# Patient Record
Sex: Male | Born: 1940 | Race: Black or African American | Hispanic: No | Marital: Married | Smoking: Never smoker
Health system: Southern US, Community
[De-identification: ages and names within clinical notes are randomized; demographics above are authoritative.]

## PROBLEM LIST (undated history)

## (undated) DIAGNOSIS — I2510 Atherosclerotic heart disease of native coronary artery without angina pectoris: Secondary | ICD-10-CM

## (undated) DIAGNOSIS — I10 Essential (primary) hypertension: Secondary | ICD-10-CM

## (undated) DIAGNOSIS — E119 Type 2 diabetes mellitus without complications: Secondary | ICD-10-CM

## (undated) DIAGNOSIS — E785 Hyperlipidemia, unspecified: Secondary | ICD-10-CM

## (undated) DIAGNOSIS — N189 Chronic kidney disease, unspecified: Secondary | ICD-10-CM

## (undated) HISTORY — PX: CARDIAC CATHETERIZATION: SHX172

## (undated) HISTORY — PX: CORONARY ANGIOPLASTY: SHX604

## (undated) HISTORY — PX: HERNIA REPAIR: SHX51

---

## 2015-01-16 ENCOUNTER — Inpatient Hospital Stay
Admission: EM | Admit: 2015-01-16 | Discharge: 2015-01-24 | DRG: 682 | Disposition: A | Discharge status: Skilled Nursing Facility | Payer: Medicare Other | Attending: Internal Medicine | Admitting: Internal Medicine

## 2015-01-16 ENCOUNTER — Emergency Department: Payer: Medicare Other

## 2015-01-16 ENCOUNTER — Encounter: Payer: Self-pay | Admitting: Internal Medicine

## 2015-01-16 DIAGNOSIS — I5033 Acute on chronic diastolic (congestive) heart failure: Secondary | ICD-10-CM | POA: Diagnosis present

## 2015-01-16 DIAGNOSIS — N184 Chronic kidney disease, stage 4 (severe): Secondary | ICD-10-CM | POA: Diagnosis present

## 2015-01-16 DIAGNOSIS — N179 Acute kidney failure, unspecified: Secondary | ICD-10-CM | POA: Diagnosis present

## 2015-01-16 DIAGNOSIS — M109 Gout, unspecified: Secondary | ICD-10-CM | POA: Diagnosis present

## 2015-01-16 DIAGNOSIS — Z79899 Other long term (current) drug therapy: Secondary | ICD-10-CM | POA: Diagnosis not present

## 2015-01-16 DIAGNOSIS — Z794 Long term (current) use of insulin: Secondary | ICD-10-CM | POA: Diagnosis not present

## 2015-01-16 DIAGNOSIS — Z8546 Personal history of malignant neoplasm of prostate: Secondary | ICD-10-CM | POA: Diagnosis not present

## 2015-01-16 DIAGNOSIS — J811 Chronic pulmonary edema: Secondary | ICD-10-CM

## 2015-01-16 DIAGNOSIS — I272 Other secondary pulmonary hypertension: Secondary | ICD-10-CM | POA: Diagnosis present

## 2015-01-16 DIAGNOSIS — N2581 Secondary hyperparathyroidism of renal origin: Secondary | ICD-10-CM | POA: Diagnosis present

## 2015-01-16 DIAGNOSIS — R0602 Shortness of breath: Secondary | ICD-10-CM

## 2015-01-16 DIAGNOSIS — I2510 Atherosclerotic heart disease of native coronary artery without angina pectoris: Secondary | ICD-10-CM | POA: Diagnosis present

## 2015-01-16 DIAGNOSIS — E785 Hyperlipidemia, unspecified: Secondary | ICD-10-CM | POA: Diagnosis present

## 2015-01-16 DIAGNOSIS — Z7982 Long term (current) use of aspirin: Secondary | ICD-10-CM | POA: Diagnosis not present

## 2015-01-16 DIAGNOSIS — E876 Hypokalemia: Secondary | ICD-10-CM | POA: Diagnosis not present

## 2015-01-16 DIAGNOSIS — D631 Anemia in chronic kidney disease: Secondary | ICD-10-CM | POA: Diagnosis present

## 2015-01-16 DIAGNOSIS — Z888 Allergy status to other drugs, medicaments and biological substances status: Secondary | ICD-10-CM

## 2015-01-16 DIAGNOSIS — E1122 Type 2 diabetes mellitus with diabetic chronic kidney disease: Secondary | ICD-10-CM | POA: Diagnosis present

## 2015-01-16 DIAGNOSIS — E8770 Fluid overload, unspecified: Secondary | ICD-10-CM

## 2015-01-16 DIAGNOSIS — J810 Acute pulmonary edema: Secondary | ICD-10-CM

## 2015-01-16 DIAGNOSIS — J9601 Acute respiratory failure with hypoxia: Secondary | ICD-10-CM | POA: Diagnosis present

## 2015-01-16 DIAGNOSIS — I130 Hypertensive heart and chronic kidney disease with heart failure and stage 1 through stage 4 chronic kidney disease, or unspecified chronic kidney disease: Secondary | ICD-10-CM | POA: Diagnosis present

## 2015-01-16 HISTORY — DX: Atherosclerotic heart disease of native coronary artery without angina pectoris: I25.10

## 2015-01-16 HISTORY — DX: Type 2 diabetes mellitus without complications: E11.9

## 2015-01-16 HISTORY — DX: Essential (primary) hypertension: I10

## 2015-01-16 HISTORY — DX: Hyperlipidemia, unspecified: E78.5

## 2015-01-16 HISTORY — DX: Chronic kidney disease, unspecified: N18.9

## 2015-01-16 LAB — GLUCOSE, CAPILLARY: GLUCOSE-CAPILLARY: 288 mg/dL — AB (ref 65–99)

## 2015-01-16 LAB — HEPATIC FUNCTION PANEL
ALBUMIN: 2.4 g/dL — AB (ref 3.5–5.0)
ALK PHOS: 81 U/L (ref 38–126)
ALT: 11 U/L — ABNORMAL LOW (ref 17–63)
AST: 22 U/L (ref 15–41)
BILIRUBIN TOTAL: 0.3 mg/dL (ref 0.3–1.2)
Bilirubin, Direct: <0.1 mg/dL — ABNORMAL LOW (ref 0.1–0.5)
Total Protein: 6.5 g/dL (ref 6.5–8.1)

## 2015-01-16 LAB — MAGNESIUM: Magnesium: 1.4 mg/dL — ABNORMAL LOW (ref 1.7–2.4)

## 2015-01-16 LAB — CBC WITH DIFFERENTIAL/PLATELET
BASOS ABS: 0.1 10*3/uL (ref 0–0.1)
Basophils Relative: 1 %
Eosinophils Absolute: 0.3 10*3/uL (ref 0–0.7)
Eosinophils Relative: 3 %
HEMATOCRIT: 24.6 % — AB (ref 40.0–52.0)
Hemoglobin: 8.2 g/dL — ABNORMAL LOW (ref 13.0–18.0)
LYMPHS PCT: 5 %
Lymphs Abs: 0.5 10*3/uL — ABNORMAL LOW (ref 1.0–3.6)
MCH: 28.3 pg (ref 26.0–34.0)
MCHC: 33.3 g/dL (ref 32.0–36.0)
MCV: 85.1 fL (ref 80.0–100.0)
MONO ABS: 0.4 10*3/uL (ref 0.2–1.0)
Monocytes Relative: 4 %
NEUTROS ABS: 9 10*3/uL — AB (ref 1.4–6.5)
Neutrophils Relative %: 87 %
Platelets: 277 10*3/uL (ref 150–440)
RBC: 2.89 MIL/uL — AB (ref 4.40–5.90)
RDW: 16 % — ABNORMAL HIGH (ref 11.5–14.5)
WBC: 10.3 10*3/uL (ref 3.8–10.6)

## 2015-01-16 LAB — BASIC METABOLIC PANEL
ANION GAP: 10 (ref 5–15)
BUN: 52 mg/dL — ABNORMAL HIGH (ref 6–20)
CHLORIDE: 105 mmol/L (ref 101–111)
CO2: 25 mmol/L (ref 22–32)
Calcium: 6.4 mg/dL — CL (ref 8.9–10.3)
Creatinine, Ser: 4.6 mg/dL — ABNORMAL HIGH (ref 0.61–1.24)
GFR calc Af Amer: 13 mL/min — ABNORMAL LOW (ref >60)
GFR calc non Af Amer: 11 mL/min — ABNORMAL LOW (ref >60)
GLUCOSE: 386 mg/dL — AB (ref 65–99)
POTASSIUM: 3.3 mmol/L — AB (ref 3.5–5.1)
Sodium: 140 mmol/L (ref 135–145)

## 2015-01-16 LAB — INFLUENZA PANEL BY PCR (TYPE A & B)

## 2015-01-16 LAB — TROPONIN I: Troponin I: <0.03 ng/mL (ref <0.031)

## 2015-01-16 LAB — BRAIN NATRIURETIC PEPTIDE: B NATRIURETIC PEPTIDE 5: 522 pg/mL — AB (ref 0.0–100.0)

## 2015-01-16 MED ORDER — SODIUM CHLORIDE 0.9 % IV SOLN
1.0000 g | Freq: Once | INTRAVENOUS | Status: AC
  Administered 2015-01-16: 1 g via INTRAVENOUS
  Filled 2015-01-16: qty 10

## 2015-01-16 MED ORDER — IPRATROPIUM-ALBUTEROL 0.5-2.5 (3) MG/3ML IN SOLN
3.0000 mL | Freq: Once | RESPIRATORY_TRACT | Status: AC
  Administered 2015-01-16: 3 mL via RESPIRATORY_TRACT
  Filled 2015-01-16: qty 3

## 2015-01-16 MED ORDER — INSULIN ASPART 100 UNIT/ML ~~LOC~~ SOLN: 0.0000 [IU] | Freq: Three times a day (TID) | SUBCUTANEOUS | Status: DC

## 2015-01-16 MED ORDER — ASPIRIN 81 MG PO CHEW
81.0000 mg | CHEWABLE_TABLET | Freq: Every day | ORAL | Status: DC
  Administered 2015-01-17 – 2015-01-24 (×8): 81 mg via ORAL
  Filled 2015-01-16 (×8): qty 1

## 2015-01-16 MED ORDER — HEPARIN SODIUM (PORCINE) 5000 UNIT/ML IJ SOLN
5000.0000 [IU] | Freq: Three times a day (TID) | INTRAMUSCULAR | Status: DC
  Administered 2015-01-17 – 2015-01-24 (×22): 5000 [IU] via SUBCUTANEOUS
  Filled 2015-01-16 (×23): qty 1

## 2015-01-16 MED ORDER — INSULIN ASPART 100 UNIT/ML ~~LOC~~ SOLN
0.0000 [IU] | Freq: Three times a day (TID) | SUBCUTANEOUS | Status: DC
  Administered 2015-01-17: 3 [IU] via SUBCUTANEOUS
  Administered 2015-01-17: 5 [IU] via SUBCUTANEOUS
  Administered 2015-01-17: 2 [IU] via SUBCUTANEOUS
  Administered 2015-01-18 (×2): 1 [IU] via SUBCUTANEOUS
  Administered 2015-01-19: 5 [IU] via SUBCUTANEOUS
  Administered 2015-01-19: 3 [IU] via SUBCUTANEOUS
  Administered 2015-01-19: 2 [IU] via SUBCUTANEOUS
  Filled 2015-01-16: qty 5
  Filled 2015-01-16: qty 2
  Filled 2015-01-16: qty 5
  Filled 2015-01-16 (×2): qty 1
  Filled 2015-01-16: qty 2
  Filled 2015-01-16 (×2): qty 3

## 2015-01-16 MED ORDER — METHYLPREDNISOLONE SODIUM SUCC 125 MG IJ SOLR
125.0000 mg | INTRAMUSCULAR | Status: AC
  Administered 2015-01-16: 125 mg via INTRAVENOUS
  Filled 2015-01-16: qty 2

## 2015-01-16 MED ORDER — METOPROLOL TARTRATE 25 MG PO TABS
25.0000 mg | ORAL_TABLET | Freq: Two times a day (BID) | ORAL | Status: DC
  Administered 2015-01-17 – 2015-01-18 (×4): 25 mg via ORAL
  Filled 2015-01-16 (×4): qty 1

## 2015-01-16 MED ORDER — FUROSEMIDE 10 MG/ML IJ SOLN
60.0000 mg | Freq: Once | INTRAMUSCULAR | Status: AC
  Administered 2015-01-16: 60 mg via INTRAVENOUS
  Filled 2015-01-16: qty 8

## 2015-01-16 MED ORDER — AMLODIPINE BESYLATE 5 MG PO TABS
5.0000 mg | ORAL_TABLET | Freq: Every day | ORAL | Status: DC
  Administered 2015-01-17 – 2015-01-18 (×2): 5 mg via ORAL
  Filled 2015-01-16 (×2): qty 1

## 2015-01-16 MED ORDER — CALCIUM GLUCONATE 10 % IV SOLN: 1.0000 g | Freq: Once | INTRAVENOUS | Status: DC

## 2015-01-16 MED ORDER — FUROSEMIDE 10 MG/ML IJ SOLN
20.0000 mg | Freq: Three times a day (TID) | INTRAMUSCULAR | Status: DC
  Administered 2015-01-17 (×2): 20 mg via INTRAVENOUS
  Filled 2015-01-16 (×2): qty 2

## 2015-01-16 NOTE — H&P (Signed)
[facility]   PATIENT NAME: Kevin Singleton    MR#:  [id number]  DATE OF BIRTH:  [birth year]  DATE OF ADMISSION:  01/16/2015  PRIMARY CARE PHYSICIAN: Donell SievertMILLER,AARON, MD   REQUESTING/REFERRING PHYSICIAN: Quale  CHIEF COMPLAINT:   Chief Complaint  Patient presents with  . Shortness of Breath    HISTORY OF PRESENT ILLNESS: Kevin Singleton  is a 74 y.o. male with a known history of chronic kidney disease, hypertension, diabetes, hyperlipidemia, coronary artery disease status post stent- lives at home alone. Has gradual worsening in his kidney function so primary care doctor increased the dose of Lasix to 40 mg twice a day- last month. And also referred for kidney biopsy- which [facility] second of December. Patient has not seen the nephrologist yet. For last 3-4 days his shortness of breath is present gradually getting worse, and it decreased his mobility and functions. Concerned with this his daughters brought him to emergency room. They also noted the swelling on his legs and arms. As per them patient remains sleepy and that is usual for him even in the day time taking naps in between, so in ER currently patient is feeling very sleepy and goes back to sleep on arousable immediately so most of this history is obtained from his daughters were present in the room.  PAST MEDICAL HISTORY:   Past Medical History  Diagnosis Date  . Chronic kidney disease   . Coronary artery disease   . Hypertension   . Diabetes mellitus without complication (HCC)   . Hyperlipidemia     PAST SURGICAL HISTORY: No past surgical history on file.  SOCIAL HISTORY:  Social History  Substance Use Topics  . Smoking status: Never Smoker   . Smokeless tobacco: Not on file  . Alcohol Use: No    FAMILY HISTORY:  Family History  Problem Relation Age of Onset  . Breast cancer Mother   . Stroke Father     DRUG ALLERGIES:  Allergies  Allergen Reactions  .  Neosporin [Neomycin-Bacitracin Zn-Polymyx] Swelling and Rash    REVIEW OF SYSTEMS:   Patient is very sleepy so unable to give me review of system.  MEDICATIONS AT HOME:  Prior to Admission medications   Medication Sig Start Date End Date Taking? Authorizing Provider  furosemide (LASIX) 40 MG tablet Take 40 mg by mouth 2 (two) times daily.   Yes Historical Provider, MD      PHYSICAL EXAMINATION:   VITAL SIGNS: Blood pressure 160/88, pulse 80, temperature 98 F (36.7 C), temperature source Oral, resp. rate 14, height 5\' 11"  (1.803 m), weight 109.317 kg (241 lb), SpO2 92 %.  GENERAL:  74 y.o.-year-old patient lying in the bed with no acute distress.  EYES: Pupils equal, round, reactive to light and accommodation. No scleral icterus. Extraocular muscles intact.  HEENT: Head atraumatic, normocephalic. Oropharynx and nasopharynx clear.  NECK:  Supple, no jugular venous distention. No thyroid enlargement, no tenderness.  LUNGS: Normal breath sounds bilaterally, no wheezing, positive for crepitation. No use of accessory muscles of respiration. Using nasal cannula supplemental oxygen. CARDIOVASCULAR: S1, S2 normal. No murmurs, rubs, or gallops.  ABDOMEN: Soft, nontender, nondistended. Bowel sounds present. No organomegaly or mass.  EXTREMITIES: Mild pedal edema, no cyanosis, or clubbing.  NEUROLOGIC: Cranial nerves II through XII are intact. Muscle strength 5/5 in all extremities. Sensation intact. Gait not checked.  PSYCHIATRIC: The patient is sleepy, arousable to stimuli but easily goes back to  sleep.  SKIN: No obvious rash, lesion, or ulcer.   LABORATORY PANEL:   CBC  Recent Labs Lab 01/16/15 2031  WBC 10.3  HGB 8.2*  HCT 24.6*  PLT 277  MCV 85.1  MCH 28.3  MCHC 33.3  RDW 16.0*  LYMPHSABS 0.5*  MONOABS 0.4  EOSABS 0.3  BASOSABS 0.1   ------------------------------------------------------------------------------------------------------------------  Chemistries   Recent  Labs Lab 01/16/15 2031  NA 140  K 3.3*  CL 105  CO2 25  GLUCOSE 386*  BUN 52*  CREATININE 4.60*  CALCIUM 6.4*   ------------------------------------------------------------------------------------------------------------------ estimated creatinine clearance is 17.2 mL/min (by C-G formula based on Cr of 4.6). ------------------------------------------------------------------------------------------------------------------ No results for input(s): TSH, T4TOTAL, T3FREE, THYROIDAB in the last 72 hours.  Invalid input(s): FREET3   Coagulation profile No results for input(s): INR, PROTIME in the last 168 hours. ------------------------------------------------------------------------------------------------------------------- No results for input(s): DDIMER in the last 72 hours. -------------------------------------------------------------------------------------------------------------------  Cardiac Enzymes  Recent Labs Lab 01/16/15 2031  TROPONINI <0.03   ------------------------------------------------------------------------------------------------------------------ Invalid input(s): POCBNP  ---------------------------------------------------------------------------------------------------------------  Urinalysis No results found for: COLORURINE, APPEARANCEUR, LABSPEC, PHURINE, GLUCOSEU, HGBUR, BILIRUBINUR, KETONESUR, PROTEINUR, UROBILINOGEN, NITRITE, LEUKOCYTESUR   RADIOLOGY: Dg Chest Port 1 View  01/16/2015  CLINICAL DATA:  Shortness of Breath EXAM: PORTABLE CHEST 1 VIEW COMPARISON:  None. FINDINGS: There is cardiomegaly with interstitial edema. There is consolidation in the lung bases. There is fluid tracking into the minor fissure on the right. There is mild pulmonary venous hypertension. No adenopathy. No bone lesions. IMPRESSION: Evidence of congestive heart failure. Question alveolar edema versus superimposed pneumonia in the bases. Both entities may exist  concurrently. Electronically Signed   By: Bretta Bang III M.D.   On: 01/16/2015 20:57    IMPRESSION AND PLAN:  * Fluid overload- ac hypoxic respi failure.  Worsening renal failure.  Ac on ch renal failure   IV lasix for now.   nephro consult.  * Hypocalcemia  Check albumin level, magnesium level.  IV replacement and recheck tomorrow.  * Acute diastolic congestive heart failure  Patient has an echocardiogram done in August 2016 at [facility]- as per that ejection fraction 55-60 but has left-sided hypertrophy  This heart failure is precipitated by fluid overload secondary to kidney failure.  * Anemia- most likely anemia of chronic disease  Hemoglobin is 8.2, likely secondary to renal failure.  * Hypertension  Amlodipine and metoprolol for now and after confirming his home medication will need to resume that.  * Diabetes  He takes metformin at home as per daughter but because of renal failure hold it and just keep him on insulin sliding scale coverage.  * History of coronary artery disease  Currently continue aspirin and metoprolol and we need to confirm his home medication and resume them as soon as possible.  All the records are reviewed and case discussed with ED provider. Management plans discussed with the patient, family and they are in agreement.  CODE STATUS: Full code   TOTAL TIME TAKING CARE OF THIS PATIENT: 50 minutes.    Altamese Dilling M.D on 01/16/2015   Between 7am to 6pm - Pager - [phone]  After 6pm go to [email or web address] - password [email or web address]  [facility]  Office  [phone]  CC: Primary care physician; Donell Sievert, MD   Note: This dictation was prepared with Dragon dictation along with smaller phrase technology. Any transcriptional errors that result from this process are unintentional.

## 2015-01-16 NOTE — ED Notes (Addendum)
Pt arrives to ED via EMS from home d/t worsening SHOB throughout the day and generalized edema. Per EMS, pt received 1 Duo-Neb t/x en route, and reports OTC cold medicine taken today, but unsure of exact type. Pt arrives on 2L O2 via [place], in NAD, with rapid respirations in the 30s, but regular, even and unlabored. Upon arrival to room/triage, O2 sats measured at 77% on RA; pt again placed on 2L O2 via [place].

## 2015-01-16 NOTE — ED Provider Notes (Signed)
[facility] Emergency Department Provider Note REMINDER - THIS NOTE IS NOT A FINAL MEDICAL RECORD UNTIL IT IS SIGNED. UNTIL THEN, THE CONTENT BELOW MAY REFLECT INFORMATION FROM A DOCUMENTATION TEMPLATE, NOT THE ACTUAL PATIENT VISIT. ____________________________________________  Time seen: Approximately 8:23 PM  I have reviewed the triage vital signs and the nursing notes.   HISTORY  Chief Complaint Shortness of Breath  EM caveat: Patient extremely vague and poor historian. He is unable to tell me what his current medical conditions are, his medication list, specify his symptoms well. It is notably is fully alert, process seems to have very poor ability to give a clear history. 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  Family provides additional history on previous medical conditions, states he has no known allergies except to topical Neosporin but nothing by mouth. A UA do not have a list of his medications and unable to recall them at this time.  HPI Kevin Singleton is a 74 y.o. male history of cardiac disease, diabetes, hypertension and presents for dyspnea and cough worsening over the last 2 days. Also associated some swelling and weight gain over the last 1 month in his lower legs bilaterally.  States he is achy, but not hurting anyone spot. Denies chest pain nausea or vomiting. Denies abdominal pain. Does state that his doctors been worried about his kidneys lately, and he had increasing swelling in the lower legs for approximately the last 1-2 months.   Past Medical History  Diagnosis Date  . Chronic kidney disease   . Coronary artery disease   . Hypertension   . Diabetes mellitus without complication (HCC)   . Hyperlipidemia     Patient Active Problem List   Diagnosis Date Noted  . Fluid overload 01/16/2015    No past surgical history on file.  No current outpatient prescriptions on file.  Allergies Neosporin  Family  History  Problem Relation Age of Onset  . Breast cancer Mother   . Stroke Father     Social History Social History  Substance Use Topics  . Smoking status: Never Smoker   . Smokeless tobacco: Not on file  . Alcohol Use: No    Review of Systems Patient somewhat poor historian, but primary complaint disease had increasing shortness of breath and a dry nonproductive cough. Review of systems he denies any acute pain except for being generally achy. He is unable to provide a clear and concise review of systems. He does deny fevers. No leg swelling. No history of blood clots or recent trauma.    ____________________________________________   PHYSICAL EXAM:  VITAL SIGNS: ED Triage Vitals  Enc Vitals Group     BP --      Pulse --      Resp --      Temp --      Temp src --      SpO2 01/16/15 2019 91 %     Weight --      Height --      Head Cir --      Peak Flow --      Pain Score --      Pain Loc --      Pain Edu? --      Excl. in GC? --    Constitutional: Alert and oriented. Moderately dyspneic and slightly pale. Eyes: Conjunctivae are normal. PERRL. EOMI. Head: Atraumatic. Nose: No congestion/rhinnorhea. Mouth/Throat: Mucous membranes are moist.  Oropharynx non-erythematous. Neck: No  stridor.   Cardiovascular: Normal rate, regular rhythm. Grossly normal heart sounds.  Good peripheral circulation. Respiratory: Normal respiratory effort.  Moderate increased with breathing, slight end expiratory wheezing but notable rales in the bases bilaterally. Mild use of accessory muscles. He is not in any significant distress, however does have mild to moderate increased work of breathing speaking in 4-5 word phrases. Patient notably hypoxic to the mid 70s on room air Gastrointestinal: Soft and nontender. No distention. No abdominal bruits. No CVA tenderness. Musculoskeletal: No lower extremity tenderness nor edema.  No joint effusions. Neurologic:  Normal speech and language. No gross  focal neurologic deficits are appreciated. No gait instability. Skin:  Skin is warm, dry and intact. No rash noted. Psychiatric: Mood and affect are normal. Speech and behavior are normal.  ____________________________________________   LABS (all labs ordered are listed, but only abnormal results are displayed)  Labs Reviewed  BRAIN NATRIURETIC PEPTIDE - Abnormal; Notable for the following:    B Natriuretic Peptide 522.0 (*)    All other components within normal limits  CBC WITH DIFFERENTIAL/PLATELET - Abnormal; Notable for the following:    RBC 2.89 (*)    Hemoglobin 8.2 (*)    HCT 24.6 (*)    RDW 16.0 (*)    Neutro Abs 9.0 (*)    Lymphs Abs 0.5 (*)    All other components within normal limits  BASIC METABOLIC PANEL - Abnormal; Notable for the following:    Potassium 3.3 (*)    Glucose, Bld 386 (*)    BUN 52 (*)    Creatinine, Ser 4.60 (*)    Calcium 6.4 (*)    GFR calc non Af Amer 11 (*)    GFR calc Af Amer 13 (*)    All other components within normal limits  HEPATIC FUNCTION PANEL - Abnormal; Notable for the following:    Albumin 2.4 (*)    ALT 11 (*)    Bilirubin, Direct <0.1 (*)    All other components within normal limits  MAGNESIUM - Abnormal; Notable for the following:    Magnesium 1.4 (*)    All other components within normal limits  GLUCOSE, CAPILLARY - Abnormal; Notable for the following:    Glucose-Capillary 288 (*)    All other components within normal limits  TROPONIN I  INFLUENZA PANEL BY PCR (TYPE A & B, H1N1)  ALBUMIN  BASIC METABOLIC PANEL  CBC   ____________________________________________  EKG  Reviewed and interpreted me at 2030 hrs. Heart rate 90 Sinus rhythm, borderline prolonged QT, no evidence of acute ST abnormality though some artifact is noted. QTC 500 PR 150 QRS 90 ____________________________________________  RADIOLOGY  DG Chest Port 1 View (Final result) Result time: 01/16/15 20:57:47   Final result by Rad Results In  Interface (01/16/15 20:57:47)   Narrative:   CLINICAL DATA: Shortness of Breath  EXAM: PORTABLE CHEST 1 VIEW  COMPARISON: None.  FINDINGS: There is cardiomegaly with interstitial edema. There is consolidation in the lung bases. There is fluid tracking into the minor fissure on the right. There is mild pulmonary venous hypertension. No adenopathy. No bone lesions.  IMPRESSION: Evidence of congestive heart failure. Question alveolar edema versus superimposed pneumonia in the bases. Both entities may exist concurrently.     ____________________________________________   PROCEDURES  Procedure(s) performed: None  Critical Care performed: Yes, see critical care note(s)  CRITICAL CARE Performed by: Sharyn Creamer   Total critical care time: 35 minutes  Critical care time was exclusive of separately  billable procedures and treating other patients.  Critical care was necessary to treat or prevent imminent or life-threatening deterioration.  Critical care was time spent personally by me on the following activities: development of treatment plan with patient and/or surrogate as well as nursing, discussions with consultants, evaluation of patient's response to treatment, examination of patient, obtaining history from patient or surrogate, ordering and performing treatments and interventions, ordering and review of laboratory studies, ordering and review of radiographic studies, pulse oximetry and re-evaluation of patient's condition.  Patient presented with acute hypoxia with oxygen saturation less than 80% requiring emergent evaluation for possible respiratory failure and to prevent life-threatening morbidity. ____________________________________________   INITIAL IMPRESSION / ASSESSMENT AND PLAN / ED COURSE  Pertinent labs & imaging results that were available during my care of the patient were reviewed by me and considered in my medical decision making (see chart for  details).  Patient's chief complaint is of dyspnea. He does not appear to have any obvious infectious symptoms, he is  ----------------------------------------- 9:39 PM on 01/16/2015 -----------------------------------------  Patient resting. Alerts to voice, denies any pain and reports his breathing is improved. Patient's labs and history with [facility] now visible via care everywhere appear to show worsening renal function. He denies any infectious symptoms, he has no fever, his white blood cell count is normal. Elevated BUN, BNP, and GFR now of 11. Appears to be most consistent with worsening acute kidney and chronic renal insufficiency. Given the patient's volume overload, which is my working diagnosis at this time we will admit her to the hospital for further evaluation I will give him a dose of 60 Lasix IV. Patient family updated, they are agreeable with the plan for admission. Continue to observe him closely, oxygenation is improved.     ____________________________________________   FINAL CLINICAL IMPRESSION(S) / ED DIAGNOSES  Final diagnoses:  Pulmonary edema      Sharyn CreamerMark Tersea Aulds, MD 01/16/15 2358

## 2015-01-17 ENCOUNTER — Inpatient Hospital Stay: Payer: Medicare Other

## 2015-01-17 LAB — BASIC METABOLIC PANEL
Anion gap: 9 (ref 5–15)
BUN: 53 mg/dL — AB (ref 6–20)
CHLORIDE: 108 mmol/L (ref 101–111)
CO2: 25 mmol/L (ref 22–32)
CREATININE: 4.95 mg/dL — AB (ref 0.61–1.24)
Calcium: 6.6 mg/dL — ABNORMAL LOW (ref 8.9–10.3)
GFR calc Af Amer: 12 mL/min — ABNORMAL LOW (ref >60)
GFR calc non Af Amer: 10 mL/min — ABNORMAL LOW (ref >60)
Glucose, Bld: 256 mg/dL — ABNORMAL HIGH (ref 65–99)
POTASSIUM: 3.5 mmol/L (ref 3.5–5.1)
SODIUM: 142 mmol/L (ref 135–145)

## 2015-01-17 LAB — IRON AND TIBC
IRON: 31 ug/dL — AB (ref 45–182)
Saturation Ratios: 18 % (ref 17.9–39.5)
TIBC: 174 ug/dL — AB (ref 250–450)
UIBC: 143 ug/dL

## 2015-01-17 LAB — CBC
HEMATOCRIT: 22.3 % — AB (ref 40.0–52.0)
Hemoglobin: 7.5 g/dL — ABNORMAL LOW (ref 13.0–18.0)
MCH: 28.3 pg (ref 26.0–34.0)
MCHC: 33.5 g/dL (ref 32.0–36.0)
MCV: 84.5 fL (ref 80.0–100.0)
PLATELETS: 244 10*3/uL (ref 150–440)
RBC: 2.64 MIL/uL — AB (ref 4.40–5.90)
RDW: 15.3 % — AB (ref 11.5–14.5)
WBC: 7.9 10*3/uL (ref 3.8–10.6)

## 2015-01-17 LAB — FERRITIN: Ferritin: 119 ng/mL (ref 24–336)

## 2015-01-17 LAB — GLUCOSE, CAPILLARY
GLUCOSE-CAPILLARY: 259 mg/dL — AB (ref 65–99)
GLUCOSE-CAPILLARY: 174 mg/dL — AB (ref 65–99)
Glucose-Capillary: 225 mg/dL — ABNORMAL HIGH (ref 65–99)
Glucose-Capillary: 142 mg/dL — ABNORMAL HIGH (ref 65–99)

## 2015-01-17 MED ORDER — BUDESONIDE 0.25 MG/2ML IN SUSP
0.2500 mg | Freq: Two times a day (BID) | RESPIRATORY_TRACT | Status: DC
  Administered 2015-01-17 – 2015-01-20 (×7): 0.25 mg via RESPIRATORY_TRACT
  Filled 2015-01-17 (×7): qty 2

## 2015-01-17 MED ORDER — IPRATROPIUM-ALBUTEROL 0.5-2.5 (3) MG/3ML IN SOLN
3.0000 mL | Freq: Four times a day (QID) | RESPIRATORY_TRACT | Status: DC
  Administered 2015-01-17 – 2015-01-19 (×10): 3 mL via RESPIRATORY_TRACT
  Filled 2015-01-17 (×10): qty 3

## 2015-01-17 MED ORDER — FUROSEMIDE 10 MG/ML IJ SOLN
60.0000 mg | Freq: Two times a day (BID) | INTRAMUSCULAR | Status: DC
  Administered 2015-01-17 – 2015-01-18 (×3): 60 mg via INTRAVENOUS
  Filled 2015-01-17 (×3): qty 6

## 2015-01-17 NOTE — Progress Notes (Signed)
Initial Nutrition Assessment   INTERVENTION:   Meals and Snacks: Cater to patient preferences. Recommend addition of Carb Modified diet order to current Renal Diet order secondary to h/o DM and elevated FSBS currently. Noted fluid restriction. Medical Food Supplement Therapy: will recommend on follow if intake poor Coordination of Care: recommend daily weights   NUTRITION DIAGNOSIS:   Altered nutrition lab value related to acute illness as evidenced by  (Electrolyte and renal profile).  GOAL:   Patient will meet greater than or equal to 90% of their needs  MONITOR:    (Energy Intake, Electrolyte and renal Profile, Anthropometrics)  REASON FOR ASSESSMENT:    (Diet Order)    ASSESSMENT:   Pt admitted with SOB and fluid overload with CHF and acute renal failure/chronic kidney disease stage IV/proteinuria per Nephrology; no acute indication for dialysis however may need to consider pending. Plan for renal ultrasound per MD note.  Past Medical History  Diagnosis Date  . Chronic kidney disease   . Coronary artery disease   . Hypertension   . Diabetes mellitus without complication (HCC)   . Hyperlipidemia     Diet Order:  Diet renal/carb modified with fluid restriction Diet-HS Snack?: Nothing; Room service appropriate?: Yes; Fluid consistency:: Thin; Fluid restriction:: 1200 mL Fluid    Current Nutrition: Pt sleeping soundly on visit this afternoon. 100% of recorded meals.  Food/Nutrition-Related History: Per MST no decrease in appetite PTA.   Scheduled Medications:  . amLODipine  5 mg Oral Daily  . aspirin  81 mg Oral Daily  . budesonide (PULMICORT) nebulizer solution  0.25 mg Nebulization BID  . furosemide  60 mg Intravenous BID  . heparin  5,000 Units Subcutaneous 3 times per day  . insulin aspart  0-9 Units Subcutaneous TID WC  . ipratropium-albuterol  3 mL Nebulization Q6H  . metoprolol tartrate  25 mg Oral BID     Electrolyte/Renal Profile and Glucose  Profile:   Recent Labs Lab 01/16/15 2031 01/17/15 0538  NA 140 142  K 3.3* 3.5  CL 105 108  CO2 25 25  BUN 52* 53*  CREATININE 4.60* 4.95*  CALCIUM 6.4* 6.6*  MG 1.4*  --   GLUCOSE 386* 256*   Protein Profile:   Recent Labs Lab 01/16/15 2031  ALBUMIN 2.4*    Gastrointestinal Profile: Last BM: 01/15/2015   Nutrition-Focused Physical Exam Findings:  Unable to complete Nutrition-Focused physical exam at this time.    Weight Change: No weight trend per CHL. No weight decrease per MST.   Height:   Ht Readings from Last 1 Encounters:  01/16/15 5\' 11"  (1.803 m)    Weight:   Wt Readings from Last 1 Encounters:  01/16/15 238 lb 12.8 oz (108.319 kg)    Ideal Body Weight:   78kg  BMI:  Body mass index is 33.32 kg/(m^2).  Estimated Nutritional Needs:   Kcal:  BEE: 1527kcals, TEE: (IF 1.1-1.3)(AF 1.2) [id number], using IBW of 78kg  Protein:  78-94g protein (1.0-1.2g/kg)  Fluid:  1950-231640mL of fluid (25-3230mL/kg)  EDUCATION NEEDS:   Education needs no appropriate at this time   MODERATE Care Level  Leda QuailAllyson Swayze Kozuch, RD, LDN Pager [phone]

## 2015-01-17 NOTE — Progress Notes (Signed)
Patient ID: Kevin Singleton, male   DOB: [birth year], 74 y.o.   MRN: [id number] [facility] Physicians PROGRESS NOTE  PCP: Donell Sievert, MD  HPI/Subjective: Patient still short of breath. States he is urinating well. Still wheezing.  Objective: Filed Vitals:   01/17/15 0345 01/17/15 0734  BP: 151/60 153/84  Pulse: 76 79  Temp: 98.4 F (36.9 C) 98.7 F (37.1 C)  Resp: 20 18    Filed Weights   01/16/15 2024 01/16/15 2326  Weight: 109.317 kg (241 lb) 108.319 kg (238 lb 12.8 oz)    ROS: Review of Systems  Constitutional: Negative for fever and chills.  Eyes: Negative for blurred vision.  Respiratory: Positive for cough, shortness of breath and wheezing.   Cardiovascular: Negative for chest pain.  Gastrointestinal: Negative for nausea, vomiting, abdominal pain, diarrhea and constipation.  Genitourinary: Negative for dysuria.  Musculoskeletal: Negative for joint pain.  Neurological: Negative for dizziness and headaches.   Exam: Physical Exam  Constitutional: He is oriented to person, place, and time.  HENT:  Nose: No mucosal edema.  Mouth/Throat: No oropharyngeal exudate or posterior oropharyngeal edema.  Eyes: Conjunctivae, EOM and lids are normal. Pupils are equal, round, and reactive to light.  Neck: No JVD present. Carotid bruit is not present. No thyroid mass and no thyromegaly present.  Cardiovascular: S1 normal and S2 normal.  Exam reveals no gallop.   Murmur heard.  Systolic murmur is present with a grade of 2/6  Pulses:      Dorsalis pedis pulses are 2+ on the right side, and 2+ on the left side.  Respiratory: No respiratory distress. He has decreased breath sounds in the right lower field and the left lower field. He has wheezes in the right upper field, the right middle field, the right lower field, the left upper field, the left middle field and the left lower field. He has no rhonchi. He has no rales.  GI: Soft. Bowel sounds are normal. He exhibits distension. There  is no tenderness.  Musculoskeletal:       Right ankle: He exhibits swelling.       Left ankle: He exhibits swelling.  Lymphadenopathy:    He has no cervical adenopathy.  Neurological: He is alert and oriented to person, place, and time. No cranial nerve deficit.  Skin: Skin is warm. No rash noted. Nails show no clubbing.  Psychiatric: He has a normal mood and affect.    Data Reviewed: Basic Metabolic Panel:  Recent Labs Lab 01/16/15 2031 01/17/15 0538  NA 140 142  K 3.3* 3.5  CL 105 108  CO2 25 25  GLUCOSE 386* 256*  BUN 52* 53*  CREATININE 4.60* 4.95*  CALCIUM 6.4* 6.6*  MG 1.4*  --    Liver Function Tests:  Recent Labs Lab 01/16/15 2031  AST 22  ALT 11*  ALKPHOS 81  BILITOT 0.3  PROT 6.5  ALBUMIN 2.4*   CBC:  Recent Labs Lab 01/16/15 2031 01/17/15 0538  WBC 10.3 7.9  NEUTROABS 9.0*  --   HGB 8.2* 7.5*  HCT 24.6* 22.3*  MCV 85.1 84.5  PLT 277 244   Cardiac Enzymes:  Recent Labs Lab 01/16/15 2031  TROPONINI <0.03   BNP (last 3 results)  Recent Labs  01/16/15 2031  BNP 522.0*   Studies: Dg Chest 2 View  01/17/2015  CLINICAL DATA:  Shortness of breath for 3-4 days. History of chronic renal disease. EXAM: CHEST  2 VIEW COMPARISON:  01/16/2015 FINDINGS: Cardiomediastinal silhouette is increased. Mediastinal  contours appear intact. There is fullness of bilateral hilar regions, particularly on the right. There is no evidence of pneumothorax. There is persistent increase of the interstitial markings, and small bilateral pleural effusions with fluid tracking along the right minor fissure. Bibasilar airspace opacities may represent atelectasis versus airspace consolidation. Osseous structures are without acute abnormality. 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 Soft tissues are grossly normal. IMPRESSION: Enlarged cardiac silhouette. Persistent pulmonary edema with bilateral small pleural effusions. Bibasilar  airspace opacities may represent atelectasis versus airspace consolidation. Fullness of bilateral hilar regions, worse on the right. This may be due to pulmonary vascular congestion, however space-occupying lesion cannot be excluded. Attention on future radiographs recommended. Electronically Signed   By: Ted Mcalpineobrinka  Dimitrova M.D.   On: 01/17/2015 07:24   Dg Chest Port 1 View  01/16/2015  CLINICAL DATA:  Shortness of Breath EXAM: PORTABLE CHEST 1 VIEW COMPARISON:  None. FINDINGS: There is cardiomegaly with interstitial edema. There is consolidation in the lung bases. There is fluid tracking into the minor fissure on the right. There is mild pulmonary venous hypertension. No adenopathy. No bone lesions. IMPRESSION: Evidence of congestive heart failure. Question alveolar edema versus superimposed pneumonia in the bases. Both entities may exist concurrently. Electronically Signed   By: Bretta BangWilliam  Woodruff III M.D.   On: 01/16/2015 20:57    Scheduled Meds: . amLODipine  5 mg Oral Daily  . aspirin  81 mg Oral Daily  . budesonide (PULMICORT) nebulizer solution  0.25 mg Nebulization BID  . furosemide  60 mg Intravenous BID  . heparin  5,000 Units Subcutaneous 3 times per day  . insulin aspart  0-9 Units Subcutaneous TID WC  . ipratropium-albuterol  3 mL Nebulization Q6H  . metoprolol tartrate  25 mg Oral BID    Assessment/Plan:  1. Acute diastolic congestive heart failure and fluid overload. Change Lasix to 60 mg IV twice a day. Recent echocardiogram showed an EF of 55-60%. Patient is on low-dose metoprolol. 2. Acute renal failure on chronic kidney disease stage IV. Current creatinine 4.95 with a GFR of 12. Await nephrology consultation because could be a candidate for dialysis especially with diuresis. 3. Anemia- likely of chronic disease will send off iron studies and guaiac stools. 4. Wheeze- likely cardiac wheeze but will add nebulizer treatments 5. Type 2 diabetes mellitus- sliding scale for right  now. Can probably do low-dose glipizide  Code Status:     Code Status Orders        Start     Ordered   01/16/15 2304  Full code   Continuous     01/16/15 2303     Disposition Plan: To be determined  Consultants:  Nephrology  Time spent: 25 minutes  Alford HighlandWIETING, Baylin Cabal  [facility]

## 2015-01-17 NOTE — Consult Note (Signed)
[facility] CONSULT NOTE    Date: 01/17/2015                  Patient Name:  Kevin Singleton  MRN: [id number]  DOB: 11-23-1940  Age / Sex: 74 y.o., male         PCP: Donell Sievert, MD                 Service Requesting Consult: Dr. Madelon Lips                 Reason for Consult: Acute renal failure/CKD stage IV            History of Present Illness: Patient is a 74 y.o. 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   Patient previously had a normal echocardiogram. He has been on diuretic therapy as an outpatient. He was recently seen by [facility] nephrology on 12/23/2014. We have reviewed her note through care everywhere. It appears that he has quite significant proteinuria would most recent urine protein to creatinine ratio of 7.4. He also has history of positive ANA.  He also has an abnormal But a lambda light chain ratio. It appears that discussions were ongoing to undergo renal biopsy. The patient's renal function appears to be worse now. Creatinine was up to 4.6 and after diuresis overnight creatinine is currently 4.95.   Medications: Outpatient medications: Prescriptions prior to admission  Medication Sig Dispense Refill Last Dose  . amLODipine-benazepril (LOTREL) 10-20 MG capsule Take 1 capsule by mouth daily.   01/16/2015 at Unknown time  . aspirin 81 MG tablet Take 81 mg by mouth daily.   01/16/2015 at Unknown time  . carvedilol (COREG) 25 MG tablet Take 25 mg by mouth 2 (two) times daily with a meal.   01/16/2015 at Unknown time  . clopidogrel (PLAVIX) 75 MG tablet Take 75 mg by mouth daily.   01/16/2015 at Unknown time  . furosemide (LASIX) 40 MG tablet Take 40 mg by mouth 2 (two) times daily.     Marland Kitchen  gabapentin (NEURONTIN) 300 MG capsule Take 300 mg by mouth 3 (three) times daily.   01/16/2015 at Unknown time  . glipiZIDE (GLUCOTROL) 10 MG tablet Take 10 mg by mouth 2 (two) times daily before a meal.   01/16/2015 at Unknown time    Current medications: Current Facility-Administered Medications  Medication Dose Route Frequency Provider Last Rate Last Dose  . amLODipine (NORVASC) tablet 5 mg  5 mg Oral Daily Altamese Dilling, MD   5 mg at 01/17/15 0930  . aspirin chewable tablet 81 mg  81 mg Oral Daily Altamese Dilling, MD   81 mg at 01/17/15 0930  . budesonide (PULMICORT) nebulizer solution 0.25 mg  0.25 mg Nebulization BID Alford Highland, MD   0.25 mg at 01/17/15 0914  . furosemide (LASIX) injection 60 mg  60 mg Intravenous BID Alford Highland, MD   60 mg at 01/17/15 0933  . heparin injection 5,000 Units  5,000 Units Subcutaneous 3 times per day Altamese Dilling, MD   5,000 Units at 01/17/15 0604  . insulin aspart (novoLOG) injection 0-9 Units  0-9 Units Subcutaneous TID WC Altamese Dilling, MD   3 Units at 01/17/15 0751  . ipratropium-albuterol (DUONEB) 0.5-2.5 (3) MG/3ML nebulizer solution  3 mL  3 mL Nebulization Q6H Alford Highland, MD   3 mL at 01/17/15 0915  . metoprolol tartrate (LOPRESSOR) tablet 25 mg  25 mg Oral BID Altamese Dilling, MD   25 mg at 01/17/15 0930      Allergies: Allergies  Allergen Reactions  . Neosporin [Neomycin-Bacitracin Zn-Polymyx] Swelling and Rash      Past Medical History: Past Medical History  Diagnosis Date  . Chronic kidney disease   . Coronary artery disease   . Hypertension   . Diabetes mellitus without complication (HCC)   . Hyperlipidemia      Past Surgical History: History reviewed. No pertinent past surgical history.   Family History: Family History  Problem Relation Age of Onset  . Breast cancer Mother   . Stroke Father      Social History: Social History   Social History  . Marital Status:  Married    Spouse Name: N/A  . Number of Children: N/A  . Years of Education: N/A   Occupational History  . Not on file.   Social History Main Topics  . Smoking status: Never Smoker   . Smokeless tobacco: Not on file  . Alcohol Use: No  . Drug Use: No  . Sexual Activity: Not on file   Other Topics Concern  . Not on file   Social History Narrative     Review of Systems: As per HPI  Vital Signs: Blood pressure 153/84, pulse 79, temperature 98.7 F (37.1 C), temperature source Oral, resp. rate 18, height  (1.803 m), weight 108.319 kg (238 lb 12.8 oz), SpO2 96 %.  Weight trends: Filed Weights   01/16/15 2024 01/16/15 2326  Weight: 109.317 kg (241 lb) 108.319 kg (238 lb 12.8 oz)    Physical Exam: General: NAD, resting comfortably  Head: Normocephalic, atraumatic.  Eyes: Anicteric, EOMI  Nose: Mucous membranes moist, not inflammed, nonerythematous.  Throat: Oropharynx nonerythematous, no exudate appreciated.   Neck: Supple, trachea midline  Lungs:  Basilar rales, normal effort  Heart: RRR. S1 and S2 normal without gallop, murmur, or rubs.  Abdomen:  BS normoactive. Soft, Nondistended, non-tender.  No masses or organomegaly.  Extremities: 1+ b/l LE edema  Neurologic: A&O X3, Motor strength is 5/5 in the all 4 extremities  Skin: No visible rashes, scars.    Lab results: Basic Metabolic Panel:  Recent Labs Lab 01/16/15 2031 01/17/15 0538  NA 140 142  K 3.3* 3.5  CL 105 108  CO2 25 25  GLUCOSE 386* 256*  BUN 52* 53*  CREATININE 4.60* 4.95*  CALCIUM 6.4* 6.6*  MG 1.4*  --     Liver Function Tests:  Recent Labs Lab 01/16/15 2031  AST 22  ALT 11*  ALKPHOS 81  BILITOT 0.3  PROT 6.5  ALBUMIN 2.4*   No results for input(s): LIPASE, AMYLASE in the last 168 hours. No results for input(s): AMMONIA in the last 168 hours.  CBC:  Recent Labs Lab 01/16/15 2031 01/17/15 0538  WBC 10.3 7.9  NEUTROABS 9.0*  --   HGB 8.2* 7.5*  HCT 24.6* 22.3*   MCV 85.1 84.5  PLT 277 244    Cardiac Enzymes:  Recent Labs Lab 01/16/15 2031  TROPONINI <0.03    BNP: Invalid input(s): POCBNP  CBG:  Recent Labs Lab 01/16/15 2344 01/17/15 0732  GLUCAP 288* 225*    Microbiology: No results found for this or any previous visit.  Coagulation Studies: No results for input(s): LABPROT, INR in the last  72 hours.  Urinalysis: No results for input(s): COLORURINE, LABSPEC, PHURINE, GLUCOSEU, HGBUR, BILIRUBINUR, KETONESUR, PROTEINUR, UROBILINOGEN, NITRITE, LEUKOCYTESUR in the last 72 hours.  Invalid input(s): APPERANCEUR    Imaging: Dg Chest 2 View  01/17/2015  CLINICAL DATA:  Shortness of breath for 3-4 days. History of chronic renal disease. EXAM: CHEST  2 VIEW COMPARISON:  01/16/2015 FINDINGS: Cardiomediastinal silhouette is increased. Mediastinal contours appear intact. There is fullness of bilateral hilar regions, particularly on the right. There is no evidence of pneumothorax. There is persistent increase of the interstitial markings, and small bilateral pleural effusions with fluid tracking along the right minor fissure. Bibasilar airspace opacities may represent atelectasis versus airspace consolidation. Osseous structures are without acute abnormality. 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 Soft tissues are grossly normal. IMPRESSION: Enlarged cardiac silhouette. Persistent pulmonary edema with bilateral small pleural effusions. Bibasilar airspace opacities may represent atelectasis versus airspace consolidation. Fullness of bilateral hilar regions, worse on the right. This may be due to pulmonary vascular congestion, however space-occupying lesion cannot be excluded. Attention on future radiographs recommended. Electronically Signed   By: Ted Mcalpineobrinka  Dimitrova M.D.   On: 01/17/2015 07:24   Dg Chest Port 1 View  01/16/2015  CLINICAL DATA:  Shortness of Breath EXAM: PORTABLE CHEST 1 VIEW  COMPARISON:  None. FINDINGS: There is cardiomegaly with interstitial edema. There is consolidation in the lung bases. There is fluid tracking into the minor fissure on the right. There is mild pulmonary venous hypertension. No adenopathy. No bone lesions. IMPRESSION: Evidence of congestive heart failure. Question alveolar edema versus superimposed pneumonia in the bases. Both entities may exist concurrently. Electronically Signed   By: Bretta BangWilliam  Woodruff III M.D.   On: 01/16/2015 20:57      Assessment & Plan: Pt is a 74 y.o. 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   1. Acute renal failure/chronic kidney disease stage IV/proteinuria. [facility]  [facility] of abnormal ANA, kappa/[facility]  The patient now presents with worsening renal function. [facility]. He was last seen in October at which point in time discussions were ongoing for renal biopsy. It is unclear as to whether he had this performed. I could not find this result in care everywhere. We will proceed with renal ultrasound now to make sure there is no underlying obstruction. No urgent indication for dialysis however we may need to consider this during this admission if renal function continues to worsen.  2. Acute diastolic heart failure. The patient recently had a normal ejection fraction on echocardiogram. However he does appear to need diuresis at now which we will continue.  3. Anemia of chronic kidney disease. Hemoglobin down to 7.5. Consider transfusion for hemoglobin of less than 7.  4. Secondary hyperparathyroidism. [facility]  Check  ipth/phos today.    5.  Thanks for consultation.

## 2015-01-17 NOTE — Evaluation (Signed)
Physical Therapy Evaluation Patient Details Name: Kevin Singleton MRN: [id number] DOB: 1941/02/09 Today's Date: 01/17/2015   History of Present Illness  Patient is a 74 y/o male that presents with increasing shortness of breath and fluid retention.   Clinical Impression  Patient typically ambulates with SPC or RW he states and denies a history of falls. During this session patient demonstrates decreased gait speed with RW and decreased gait speed with head turns indicating he is at higher risk for falling. Patient is able to complete bed mobility, transfers, and ambulation without assistance aside from RW. Patient appears below his baseline in gait speed and dynamic balance and would benefit from a home strengthening program to address the above deficits. No decrease in O2 sats noted during ambulation on 4L. Skilled PT services are indicated to address the above deficits.     Follow Up Recommendations Home health PT    Equipment Recommendations       Recommendations for Other Services       Precautions / Restrictions Precautions Precautions: Fall Restrictions Weight Bearing Restrictions: No      Mobility  Bed Mobility Overal bed mobility: Needs Assistance Bed Mobility: Supine to Sit     Supine to sit: Supervision;HOB elevated     General bed mobility comments: Patient uses hand rails minimally to complete transfer, no loss of balance or cuing required.   Transfers Overall transfer level: Needs assistance Equipment used: Rolling walker (2 wheeled) Transfers: Sit to/from Stand Sit to Stand: Supervision         General transfer comment: Appropriate hand placement and balance noted, again no balance deficits noted.   Ambulation/Gait Ambulation/Gait assistance: Supervision Ambulation Distance (Feet): 200 Feet Assistive device: Rolling walker (2 wheeled) Gait Pattern/deviations: Step-through pattern;Decreased step length - right;Decreased step length - left;Trunk flexed    Gait velocity interpretation: Below normal speed for age/gender General Gait Details: Patient ambulates with RW with slow step through gait pattern, reciprocal motion noted. No loss of balance, though his gait speed indicates he is at risk for falling.   Stairs            Wheelchair Mobility    Modified Rankin (Stroke Patients Only)       Balance Overall balance assessment: Needs assistance Sitting-balance support: No upper extremity supported Sitting balance-Leahy Scale: Good     Standing balance support: Bilateral upper extremity supported Standing balance-Leahy Scale: Fair Standing balance comment: Modified DGI score of 9/12 indicating he is at higher risk for falling, decreased gait speed indicates falls risk as well.                              Pertinent Vitals/Pain Pain Assessment: 0-10 (No report of pain in this session.)    Home Living Family/patient expects to be discharged to:: Private residence Living Arrangements: Alone   Type of Home: House Home Access: Stairs to enter   [employer] of Steps: 1 Home Layout: One level Home Equipment: Environmental consultant - 2 wheels;Cane - single point      Prior Function Level of Independence: Independent with assistive device(s)         Comments: Patient reports he has been ambulating independently with RW or cane.     Hand Dominance        Extremity/Trunk Assessment   Upper Extremity Assessment: Overall WFL for tasks assessed           Lower Extremity Assessment: Overall WFL for tasks  assessed         Communication   Communication: No difficulties  Cognition Arousal/Alertness: Awake/alert Behavior During Therapy: WFL for tasks assessed/performed Overall Cognitive Status: Within Functional Limits for tasks assessed                      General Comments      Exercises        Assessment/Plan    PT Assessment Patient needs continued PT services  PT Diagnosis  Difficulty walking;Generalized weakness   PT Problem List Decreased strength;Cardiopulmonary status limiting activity;Decreased activity tolerance;Decreased balance  PT Treatment Interventions DME instruction;Therapeutic activities;Therapeutic exercise;Gait training;Balance training   PT Goals (Current goals can be found in the Care Plan section) Acute Rehab PT Goals Patient Stated Goal: To return home soon PT Goal Formulation: With patient Time For Goal Achievement: 01/31/15 Potential to Achieve Goals: Good    Frequency Min 2X/week   Barriers to discharge        Co-evaluation               End of Session Equipment Utilized During Treatment: Gait belt;Oxygen Activity Tolerance: Patient tolerated treatment well Patient left: in chair;with call bell/phone within reach;with chair alarm set Nurse Communication: Mobility status         Time: [id number] PT Time Calculation (min) (ACUTE ONLY): 15 min   Charges:   PT Evaluation $Initial PT Evaluation Tier I: 1 Procedure     PT G Codes:       Kerin RansomPatrick A Shone Leventhal, PT, DPT    01/17/2015, 10:15 AM

## 2015-01-17 NOTE — Care Management Note (Signed)
Case Management Note  Patient Details  Name: Clelia SchaumannLee Pelphrey MRN: [id number] Date of Birth: 1940-06-26  Subjective/Objective:      74yo Mr Clelia SchaumannLee Decoste was admitted 01/16/15 with shortness of breath per fluid overload. Hx;CKD stage IV, HTN, DM II, CAD with stents, prostate cancer. Followed by [facility]. PCP=Dr Donell SievertAaron Miller. [place] [place]. Lives alone but has daughters who provide transportation. Uses a rolling walker at home. No home oxygen and receives no home health services. Mr Beverely PaceCheek chose [facility] to be his provider of home health services. [facility]-PT is recommending home health PT. Case management will follow for discharge planning.                Action/Plan:   Expected Discharge Date:                  Expected Discharge Plan:     In-House Referral:     Discharge planning Services     Post Acute Care Choice:    Choice offered to:     DME Arranged:    DME Agency:     HH Arranged:    HH Agency:     Status of Service:     Medicare Important Message Given:  Yes Date Medicare IM Given:    Medicare IM give by:    Date Additional Medicare IM Given:    Additional Medicare Important Message give by:     If discussed at Long Length of Stay Meetings, dates discussed:    Additional Comments:  Hester Forget A, RN 01/17/2015, 1:43 PM

## 2015-01-17 NOTE — Care Management Important Message (Signed)
Important Message  Patient Details  Name: Kevin Singleton MRN: [id number] Date of Birth: 10-Oct-1940   Medicare Important Message Given:  Yes    Britt Theard A, RN 01/17/2015, 9:26 AM

## 2015-01-18 ENCOUNTER — Inpatient Hospital Stay: Payer: Medicare Other

## 2015-01-18 DIAGNOSIS — R0602 Shortness of breath: Secondary | ICD-10-CM

## 2015-01-18 LAB — GLUCOSE, CAPILLARY
GLUCOSE-CAPILLARY: 88 mg/dL (ref 65–99)
GLUCOSE-CAPILLARY: 151 mg/dL — AB (ref 65–99)
Glucose-Capillary: 132 mg/dL — ABNORMAL HIGH (ref 65–99)
Glucose-Capillary: 257 mg/dL — ABNORMAL HIGH (ref 65–99)

## 2015-01-18 LAB — BASIC METABOLIC PANEL
ANION GAP: 8 (ref 5–15)
ANION GAP: 12 (ref 5–15)
BUN: 68 mg/dL — AB (ref 6–20)
BUN: 68 mg/dL — AB (ref 6–20)
CALCIUM: 6.4 mg/dL — AB (ref 8.9–10.3)
CHLORIDE: 104 mmol/L (ref 101–111)
CO2: 26 mmol/L (ref 22–32)
CO2: 24 mmol/L (ref 22–32)
Calcium: 6.6 mg/dL — ABNORMAL LOW (ref 8.9–10.3)
Chloride: 109 mmol/L (ref 101–111)
Creatinine, Ser: 5.36 mg/dL — ABNORMAL HIGH (ref 0.61–1.24)
Creatinine, Ser: 4.99 mg/dL — ABNORMAL HIGH (ref 0.61–1.24)
GFR calc Af Amer: 11 mL/min — ABNORMAL LOW (ref >60)
GFR calc Af Amer: 12 mL/min — ABNORMAL LOW (ref >60)
GFR calc non Af Amer: 10 mL/min — ABNORMAL LOW (ref >60)
GFR, EST NON AFRICAN AMERICAN: 9 mL/min — AB (ref >60)
GLUCOSE: 103 mg/dL — AB (ref 65–99)
GLUCOSE: 159 mg/dL — AB (ref 65–99)
POTASSIUM: 3.2 mmol/L — AB (ref 3.5–5.1)
POTASSIUM: 3.8 mmol/L (ref 3.5–5.1)
SODIUM: 143 mmol/L (ref 135–145)
Sodium: 140 mmol/L (ref 135–145)

## 2015-01-18 LAB — PHOSPHORUS: Phosphorus: 5.9 mg/dL — ABNORMAL HIGH (ref 2.5–4.6)

## 2015-01-18 LAB — TROPONIN I: Troponin I: 0.03 ng/mL (ref <0.031)

## 2015-01-18 MED ORDER — CETYLPYRIDINIUM CHLORIDE 0.05 % MT LIQD
7.0000 mL | Freq: Two times a day (BID) | OROMUCOSAL | Status: DC
  Administered 2015-01-18 – 2015-01-23 (×10): 7 mL via OROMUCOSAL

## 2015-01-18 MED ORDER — FUROSEMIDE 10 MG/ML IJ SOLN
INTRAMUSCULAR | Status: AC
  Administered 2015-01-18: 120 mg
  Filled 2015-01-18: qty 8

## 2015-01-18 MED ORDER — METHYLPREDNISOLONE SODIUM SUCC 40 MG IJ SOLR
40.0000 mg | Freq: Two times a day (BID) | INTRAMUSCULAR | Status: DC
  Administered 2015-01-18 – 2015-01-19 (×3): 40 mg via INTRAVENOUS
  Filled 2015-01-18 (×3): qty 1

## 2015-01-18 MED ORDER — NITROGLYCERIN 0.4 MG SL SUBL
SUBLINGUAL_TABLET | SUBLINGUAL | Status: AC
  Administered 2015-01-18: 0.4 mg
  Filled 2015-01-18: qty 1

## 2015-01-18 MED ORDER — HYDRALAZINE HCL 20 MG/ML IJ SOLN
INTRAMUSCULAR | Status: AC
  Filled 2015-01-18: qty 1

## 2015-01-18 MED ORDER — NITROGLYCERIN 0.4 MG SL SUBL
SUBLINGUAL_TABLET | SUBLINGUAL | Status: AC
  Administered 2015-01-18: 15:00:00
  Filled 2015-01-18: qty 1

## 2015-01-18 MED ORDER — FUROSEMIDE 10 MG/ML IJ SOLN
120.0000 mg | Freq: Once | INTRAVENOUS | Status: DC
  Filled 2015-01-18: qty 12

## 2015-01-18 MED ORDER — NITROGLYCERIN 0.4 MG SL SUBL
0.4000 mg | SUBLINGUAL_TABLET | SUBLINGUAL | Status: DC | PRN
  Administered 2015-01-22 (×3): 0.4 mg via SUBLINGUAL
  Filled 2015-01-18: qty 1

## 2015-01-18 MED ORDER — CEFTRIAXONE SODIUM 1 G IJ SOLR
1.0000 g | INTRAMUSCULAR | Status: DC
  Administered 2015-01-18 – 2015-01-19 (×2): 1 g via INTRAVENOUS
  Filled 2015-01-18 (×3): qty 10

## 2015-01-18 MED ORDER — AMLODIPINE BESYLATE 10 MG PO TABS
10.0000 mg | ORAL_TABLET | Freq: Every day | ORAL | Status: DC
  Administered 2015-01-19 – 2015-01-24 (×6): 10 mg via ORAL
  Filled 2015-01-18 (×6): qty 1

## 2015-01-18 MED ORDER — AZITHROMYCIN 250 MG PO TABS
500.0000 mg | ORAL_TABLET | Freq: Every day | ORAL | Status: AC
  Administered 2015-01-18: 500 mg via ORAL
  Filled 2015-01-18: qty 2

## 2015-01-18 MED ORDER — HYDRALAZINE HCL 20 MG/ML IJ SOLN: 10.0000 mg | Freq: Four times a day (QID) | INTRAMUSCULAR | Status: DC | PRN

## 2015-01-18 MED ORDER — HYDRALAZINE HCL 20 MG/ML IJ SOLN
20.0000 mg | Freq: Once | INTRAMUSCULAR | Status: AC
  Administered 2015-01-18: 20 mg via INTRAVENOUS

## 2015-01-18 MED ORDER — GLIPIZIDE 10 MG PO TABS
5.0000 mg | ORAL_TABLET | Freq: Every day | ORAL | Status: DC
  Administered 2015-01-19 – 2015-01-24 (×6): 5 mg via ORAL
  Filled 2015-01-18 (×6): qty 1

## 2015-01-18 MED ORDER — METOPROLOL TARTRATE 50 MG PO TABS
50.0000 mg | ORAL_TABLET | Freq: Two times a day (BID) | ORAL | Status: DC
  Administered 2015-01-18 – 2015-01-24 (×12): 50 mg via ORAL
  Filled 2015-01-18 (×12): qty 1

## 2015-01-18 MED ORDER — AZITHROMYCIN 250 MG PO TABS
250.0000 mg | ORAL_TABLET | Freq: Every day | ORAL | Status: DC
  Administered 2015-01-19 – 2015-01-20 (×2): 250 mg via ORAL
  Filled 2015-01-18 (×2): qty 1

## 2015-01-18 MED ORDER — FUROSEMIDE 10 MG/ML IJ SOLN
120.0000 mg | Freq: Two times a day (BID) | INTRAVENOUS | Status: DC
  Administered 2015-01-18: 120 mg via INTRAVENOUS
  Filled 2015-01-18 (×3): qty 12

## 2015-01-18 MED ORDER — POTASSIUM CHLORIDE CRYS ER 20 MEQ PO TBCR
20.0000 meq | EXTENDED_RELEASE_TABLET | Freq: Once | ORAL | Status: AC
  Administered 2015-01-18: 20 meq via ORAL
  Filled 2015-01-18: qty 1

## 2015-01-18 MED ORDER — FUROSEMIDE 10 MG/ML IJ SOLN
INTRAMUSCULAR | Status: AC
  Filled 2015-01-18: qty 4

## 2015-01-18 MED ORDER — HYDRALAZINE HCL 10 MG PO TABS
10.0000 mg | ORAL_TABLET | Freq: Four times a day (QID) | ORAL | Status: DC
  Administered 2015-01-18 – 2015-01-21 (×11): 10 mg via ORAL
  Filled 2015-01-18 (×11): qty 1

## 2015-01-18 NOTE — Progress Notes (Signed)
Pt very sob, o2 sat 85 binasal at 5 lpm  Rapid response called , Dr Juliene PinaMody, Maggie Schwalbeheryl Moore , [facility],  Dr Hilton SinclairWeiting , respiratory responded meds given pt transported to stepdown room 4

## 2015-01-18 NOTE — Progress Notes (Signed)
[facility]  ROUNDING NOTE   Subjective:  Renal function appears to be worsening. Creatinine up to 5.36 with a BUN of 68. Urine output 1.1 L. Discussed the case with the patient's daughter yesterday. She confirmed the patient was to have renal biopsy however this was canceled as there was a death in the family.   Objective:  Vital signs in last 24 hours:  Temp:  [97.9 F (36.6 C)-99 F (37.2 C)] 98.2 F (36.8 C) (11/26 1125) Pulse Rate:  [74-93] 76 (11/26 1125) Resp:  [18-21] 18 (11/26 1125) BP: (141-164)/(72-89) 143/74 mmHg (11/26 1125) SpO2:  [86 %-99 %] 97 % (11/26 1125) Weight:  [112.084 kg (247 lb 1.6 oz)] 112.084 kg (247 lb 1.6 oz) (11/26 0423)  Weight change: 2.767 kg (6 lb 1.6 oz) Filed Weights   01/16/15 2024 01/16/15 2326 01/18/15 0423  Weight: 109.317 kg (241 lb) 108.319 kg (238 lb 12.8 oz) 112.084 kg (247 lb 1.6 oz)    Intake/Output: I/O last 3 completed shifts: In: 1200 [P.O.:1200] Out: 1650 [Urine:1650]   Intake/Output this shift:  Total I/O In: 600 [P.O.:600] Out: 300 [Urine:300]  Physical Exam: General: NAD, resting in bed  Head: Normocephalic, atraumatic. Moist oral mucosal membranes  Eyes: Anicteric  Neck: Supple, trachea midline  Lungs:  Basilar rales, normal effort  Heart: Regular rate and rhythm  Abdomen:  Soft, nontender, BS present  Extremities: 2+ peripheral edema.  Neurologic: Nonfocal, moving all four extremities  Skin: No lesions  Access: none    Basic Metabolic Panel:  Recent Labs Lab 01/16/15 2031 01/17/15 0538 01/18/15 0518  NA 140 142 143  K 3.3* 3.5 3.2*  CL 105 108 109  CO2 GLUCOSE 386* 256* 103*  BUN 52* 53* 68*  CREATININE 4.60* 4.95* 5.36*  CALCIUM 6.4* 6.6* 6.4*  MG 1.4*  --   --     Liver Function Tests:  Recent Labs Lab 01/16/15 2031  AST 22  ALT 11*  ALKPHOS 81  BILITOT 0.3  PROT 6.5  ALBUMIN 2.4*   No results for input(s): LIPASE, AMYLASE in the last 168 hours. No  results for input(s): AMMONIA in the last 168 hours.  CBC:  Recent Labs Lab 01/16/15 2031 01/17/15 0538  WBC 10.3 7.9  NEUTROABS 9.0*  --   HGB 8.2* 7.5*  HCT 24.6* 22.3*  MCV 85.1 84.5  PLT 277 244    Cardiac Enzymes:  Recent Labs Lab 01/16/15 2031  TROPONINI <0.03    BNP: Invalid input(s): POCBNP  CBG:  Recent Labs Lab 01/17/15 1128 01/17/15 1650 01/17/15 2139 01/18/15 0753 01/18/15 1122  GLUCAP 259* 174* 142* 88 132*    Microbiology: No results found for this or any previous visit.  Coagulation Studies: No results for input(s): LABPROT, INR in the last 72 hours.  Urinalysis: No results for input(s): COLORURINE, LABSPEC, PHURINE, GLUCOSEU, HGBUR, BILIRUBINUR, KETONESUR, PROTEINUR, UROBILINOGEN, NITRITE, LEUKOCYTESUR in the last 72 hours.  Invalid input(s): APPERANCEUR    Imaging: Dg Chest 2 View  01/17/2015  CLINICAL DATA:  Shortness of breath for 3-4 days. History of chronic renal disease. EXAM: CHEST  2 VIEW COMPARISON:  01/16/2015 FINDINGS: Cardiomediastinal silhouette is increased. Mediastinal contours appear intact. There is fullness of bilateral hilar regions, particularly on the right. There is no evidence of pneumothorax. There is persistent increase of the interstitial markings, and small bilateral pleural effusions with fluid tracking along the right minor fissure. Bibasilar airspace opacities may represent atelectasis versus airspace consolidation. Osseous structures  are without acute abnormality. 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 Soft tissues are grossly normal. IMPRESSION: Enlarged cardiac silhouette. Persistent pulmonary edema with bilateral small pleural effusions. Bibasilar airspace opacities may represent atelectasis versus airspace consolidation. Fullness of bilateral hilar regions, worse on the right. This may be due to pulmonary vascular congestion, however space-occupying lesion cannot be  excluded. Attention on future radiographs recommended. Electronically Signed   By: Ted Mcalpineobrinka  Dimitrova M.D.   On: 01/17/2015 07:24   Dg Chest Port 1 View  01/16/2015  CLINICAL DATA:  Shortness of Breath EXAM: PORTABLE CHEST 1 VIEW COMPARISON:  None. FINDINGS: There is cardiomegaly with interstitial edema. There is consolidation in the lung bases. There is fluid tracking into the minor fissure on the right. There is mild pulmonary venous hypertension. No adenopathy. No bone lesions. IMPRESSION: Evidence of congestive heart failure. Question alveolar edema versus superimposed pneumonia in the bases. Both entities may exist concurrently. Electronically Signed   By: Bretta BangWilliam  Woodruff III M.D.   On: 01/16/2015 20:57     Medications:     . amLODipine  5 mg Oral Daily  . aspirin  81 mg Oral Daily  . budesonide (PULMICORT) nebulizer solution  0.25 mg Nebulization BID  . furosemide  60 mg Intravenous BID  . [START ON 01/19/2015] glipiZIDE  5 mg Oral QAC breakfast  . heparin  5,000 Units Subcutaneous 3 times per day  . insulin aspart  0-9 Units Subcutaneous TID WC  . ipratropium-albuterol  3 mL Nebulization Q6H  . methylPREDNISolone (SOLU-MEDROL) injection  40 mg Intravenous Q12H  . metoprolol tartrate  25 mg Oral BID  . potassium chloride  20 mEq Oral Once     Assessment/ Plan:  74 y.o. 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   1. Acute renal failure/chronic kidney disease stage IV/proteinuria. [facility] of abnormal ANA, [facility]. Was to have outpt renal biopsy but cancelled due to death in the family. -Renal function worsening. As before the  patient may end up requiring dialysis on this admission. However urine output was 1.1 L over the past 24 hours. At this point in time would continue diuresis.  We will continue to monitor for the need of dialysis.  2. Acute diastolic heart failure. Continue Lasix 60 mg IV twice a day for now.  3. Anemia of chronic kidney disease. No new hemoglobin today. Consider following hemoglobin over the course of the hospitalization. Transfuse for hemoglobin of less than 7.   4. Secondary hyperparathyroidism:  Awaiting phosphorus and PTH result.   LOS: 2 Yanice Maqueda 11/26/201612:22 PM

## 2015-01-18 NOTE — Progress Notes (Signed)
Rapid response called.  Upon arriving in room it was noted that patient's saturations were 85% on 5lpm [place]. Placed patient on 100%NRM, saturations increased to 100%.  EKG obtained and patient was transferred to stepdown.

## 2015-01-18 NOTE — Progress Notes (Signed)
Patient ID: Clelia SchaumannLee Malerba, male   DOB: 04/02/1940, 74 y.o.   MRN: [id number]  Called daughter Liborio NixonJanice about worsening respiratory status and transferring the patient to the ICU for closer monitoring.  My associate Dr. Juliene PinaMody had called Dr. Cherylann RatelLateef nephrology for consideration for dialysis.  120 mg of IV Lasix given, chest x-ray ordered, EKG showed no acute changes. Blood pressure- elevated with the patient's respiratory status.  Dictated by Dr. Alford Highlandichard Hadlyn Amero.

## 2015-01-18 NOTE — Progress Notes (Signed)
Patient ID: Kevin Singleton, male   DOB: 1940-07-16, 74 y.o.   MRN: [id number] [facility] PROGRESS NOTE  PCP: Donell Sievert, MD  HPI/Subjective: Patient still short of breath. Has cough and wheeze. Is still on oxygen. He states he is urinating well.  Objective: Filed Vitals:   01/18/15 0756 01/18/15 1125  BP: 148/85 143/74  Pulse: 79 76  Temp: 98.5 F (36.9 C) 98.2 F (36.8 C)  Resp: 18 18    Filed Weights   01/16/15 2024 01/16/15 2326 01/18/15 0423  Weight: 109.317 kg (241 lb) 108.319 kg (238 lb 12.8 oz) 112.084 kg (247 lb 1.6 oz)    ROS: Review of Systems  Constitutional: Negative for fever and chills.  Eyes: Negative for blurred vision.  Respiratory: Positive for cough, shortness of breath and wheezing.   Cardiovascular: Negative for chest pain.  Gastrointestinal: Negative for nausea, vomiting, abdominal pain, diarrhea and constipation.  Genitourinary: Negative for dysuria.  Musculoskeletal: Negative for joint pain.  Neurological: Negative for dizziness and headaches.   Exam: Physical Exam  Constitutional: He is oriented to person, place, and time.  HENT:  Nose: No mucosal edema.  Mouth/Throat: No oropharyngeal exudate or posterior oropharyngeal edema.  Eyes: Conjunctivae, EOM and lids are normal. Pupils are equal, round, and reactive to light.  Neck: No JVD present. Carotid bruit is not present. No thyroid mass and no thyromegaly present.  Cardiovascular: S1 normal and S2 normal.  Exam reveals no gallop.   Murmur heard.  Systolic murmur is present with a grade of 2/6  Pulses:      Dorsalis pedis pulses are 2+ on the right side, and 2+ on the left side.  Respiratory: No respiratory distress. He has decreased breath sounds in the right lower field and the left lower field. He has wheezes in the right lower field and the left lower field. He has no rhonchi. He has no rales.  GI: Soft. Bowel sounds are normal. He exhibits distension. There is no tenderness.   Musculoskeletal:       Right ankle: He exhibits swelling.       Left ankle: He exhibits swelling.  Lymphadenopathy:    He has no cervical adenopathy.  Neurological: He is alert and oriented to person, place, and time. No cranial nerve deficit.  Skin: Skin is warm. No rash noted. Nails show no clubbing.  Psychiatric: He has a normal mood and affect.    Data Reviewed: Basic Metabolic Panel:  Recent Labs Lab 01/16/15 2031 01/17/15 0538 01/18/15 0518  NA 140 142 143  K 3.3* 3.5 3.2*  CL 105 108 109  CO2 GLUCOSE 386* 256* 103*  BUN 52* 53* 68*  CREATININE 4.60* 4.95* 5.36*  CALCIUM 6.4* 6.6* 6.4*  MG 1.4*  --   --    Liver Function Tests:  Recent Labs Lab 01/16/15 2031  AST 22  ALT 11*  ALKPHOS 81  BILITOT 0.3  PROT 6.5  ALBUMIN 2.4*   CBC:  Recent Labs Lab 01/16/15 2031 01/17/15 0538  WBC 10.3 7.9  NEUTROABS 9.0*  --   HGB 8.2* 7.5*  HCT 24.6* 22.3*  MCV 85.1 84.5  PLT 277 244   Cardiac Enzymes:  Recent Labs Lab 01/16/15 2031  TROPONINI <0.03   BNP (last 3 results)  Recent Labs  01/16/15 2031  BNP 522.0*   Studies: Dg Chest 2 View  01/17/2015  CLINICAL DATA:  Shortness of breath for 3-4 days. History of chronic renal disease. EXAM: CHEST  2 VIEW COMPARISON:  01/16/2015 FINDINGS: Cardiomediastinal silhouette is increased. Mediastinal contours appear intact. There is fullness of bilateral hilar regions, particularly on the right. There is no evidence of pneumothorax. There is persistent increase of the interstitial markings, and small bilateral pleural effusions with fluid tracking along the right minor fissure. Bibasilar airspace opacities may represent atelectasis versus airspace consolidation. Osseous structures are without acute abnormality. 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 Soft tissues are grossly normal. IMPRESSION: Enlarged cardiac silhouette. Persistent pulmonary edema with bilateral  small pleural effusions. Bibasilar airspace opacities may represent atelectasis versus airspace consolidation. Fullness of bilateral hilar regions, worse on the right. This may be due to pulmonary vascular congestion, however space-occupying lesion cannot be excluded. Attention on future radiographs recommended. Electronically Signed   By: Ted Mcalpineobrinka  Dimitrova M.D.   On: 01/17/2015 07:24   Dg Chest Port 1 View  01/16/2015  CLINICAL DATA:  Shortness of Breath EXAM: PORTABLE CHEST 1 VIEW COMPARISON:  None. FINDINGS: There is cardiomegaly with interstitial edema. There is consolidation in the lung bases. There is fluid tracking into the minor fissure on the right. There is mild pulmonary venous hypertension. No adenopathy. No bone lesions. IMPRESSION: Evidence of congestive heart failure. Question alveolar edema versus superimposed pneumonia in the bases. Both entities may exist concurrently. Electronically Signed   By: Bretta BangWilliam  Woodruff III M.D.   On: 01/16/2015 20:57    Scheduled Meds: . amLODipine  5 mg Oral Daily  . aspirin  81 mg Oral Daily  . budesonide (PULMICORT) nebulizer solution  0.25 mg Nebulization BID  . furosemide  60 mg Intravenous BID  . heparin  5,000 Units Subcutaneous 3 times per day  . insulin aspart  0-9 Units Subcutaneous TID WC  . ipratropium-albuterol  3 mL Nebulization Q6H  . methylPREDNISolone (SOLU-MEDROL) injection  40 mg Intravenous Q12H  . metoprolol tartrate  25 mg Oral BID  . potassium chloride  20 mEq Oral Once    Assessment/Plan:  1. Acute diastolic congestive heart failure and fluid overload. Continue Lasix to 60 mg IV twice a day. Recent echocardiogram showed an EF of 55-60%. Patient is on low-dose metoprolol. 2. Acute renal failure on chronic kidney disease stage IV. Current creatinine worsened to 5.36. Creatinine close to dialysis range. Will let nephrology decide on that or not.  3. Anemia of chronic disease- continue to monitor hemoglobin 4. Wheeze-  likely cardiac wheeze. Will add Solu-Medrol and will continue nebulizer treatments with budesonide. 5. Type 2 diabetes mellitus- sliding scale for right now. Restart low-dose glipizide.  Code Status:     Code Status Orders        Start     Ordered   01/16/15 2304  Full code   Continuous     01/16/15 2303     Disposition Plan: To be determined, case discussed with daughter on phone.  Consultants:  Nephrology  Time spent: 22 minutes  Alford HighlandWIETING, Rome Schlauch  [facility]

## 2015-01-18 NOTE — Progress Notes (Signed)
   01/18/15 1450  Clinical Encounter Type  Visited With Patient not available  Visit Type Code  Consult/Referral To Chaplain  Chaplain responded to code and attended on the outside of patient's door. I will follow up once patient has been moved and situated in ICU.   Fisher ScientificChaplain Seung Nidiffer [phone]

## 2015-01-18 NOTE — Progress Notes (Signed)
Rapid response called on patient. Patient here w acute respiratory failure from a combination of renal failure and diastolic heart failure. This morning he was feeling well when seen by Dr. Cherylann RatelLateef. This afternoon patient started having chest pressure and increasing shortness of breath. His blood pressure systolic was noted to be over 161200. He says his chest pain is associated with shortness of breath.  Vitals: Heart rate 91 blood pressure 208/103 85% on 6 L nasal cannula GEN in acute distress with tachypnea Lungs diffuse rhonchorous sounds bilaterally with increased respiratory rate and increased work of breathing Cardiovascular distant heart sounds without obvious murmur gallops or rubs Extremities 1+ edema bilaterally  EKG NSR no ST elevation depression   Assessment and plan  74 year old male who is admitted for acute on chronic diastolic heart failure and worsening creatinine who presents now with worsening of acute respiratory hypoxic failure.   1. Acute hypoxic respiratory failure: This is secondary to fluid overload along with malignant hypertension. We have ordered 120 mg IV of Lasix, nitroglycerin sublingual, 20 mg IV of hydralazine. EKG shows normal sinus rhythm no ST elevation or depression. Patient is now requiring nonrebreather and may require BiPAP. Patient will need to be transferred to stepdown unit. Chest XRAY has been ordered.   2. Malignant hypertension: This is likely secondary to his acute fluid overload. 20 Mg IV hydralazine have been ordered. Patient is also being given nitroglycerin. Patient may need nitroglycerin drip if blood pressure is not well-controlled. I've also increase metoprolol from 25-50 mg by mouth twice a day.  3. Acute on chronic renal failure: I have spoken with Dr. Cherylann RatelLateef about patient with rapid response. Dr. Cherylann RatelLateef is aware. Patient is also aware that he may require hemodialysis for his fluid overload.   4. Chest pain: Likely from fluid overload  and malignant HTN. Troponins are likely going to be slightly elevated due to poor renal clearance. I will follow troponins and patient will need tele monitoring.   Critical care time spent 35 minutes.  Dr. Hilton SinclairWeiting will speak with patient's family.

## 2015-01-19 LAB — CBC
HCT: 22.7 % — ABNORMAL LOW (ref 40.0–52.0)
Hemoglobin: 7.5 g/dL — ABNORMAL LOW (ref 13.0–18.0)
MCH: 27.8 pg (ref 26.0–34.0)
MCHC: 33 g/dL (ref 32.0–36.0)
MCV: 84.4 fL (ref 80.0–100.0)
PLATELETS: 238 10*3/uL (ref 150–440)
RBC: 2.69 MIL/uL — ABNORMAL LOW (ref 4.40–5.90)
RDW: 15.6 % — AB (ref 11.5–14.5)
WBC: 10.9 10*3/uL — AB (ref 3.8–10.6)

## 2015-01-19 LAB — GLUCOSE, CAPILLARY
GLUCOSE-CAPILLARY: 271 mg/dL — AB (ref 65–99)
Glucose-Capillary: 231 mg/dL — ABNORMAL HIGH (ref 65–99)
Glucose-Capillary: 288 mg/dL — ABNORMAL HIGH (ref 65–99)
Glucose-Capillary: 200 mg/dL — ABNORMAL HIGH (ref 65–99)
Glucose-Capillary: 242 mg/dL — ABNORMAL HIGH (ref 65–99)

## 2015-01-19 LAB — BASIC METABOLIC PANEL
Anion gap: 11 (ref 5–15)
BUN: 72 mg/dL — AB (ref 6–20)
CHLORIDE: 105 mmol/L (ref 101–111)
CO2: 25 mmol/L (ref 22–32)
CREATININE: 4.93 mg/dL — AB (ref 0.61–1.24)
Calcium: 6.3 mg/dL — CL (ref 8.9–10.3)
GFR calc Af Amer: 12 mL/min — ABNORMAL LOW (ref >60)
GFR calc non Af Amer: 10 mL/min — ABNORMAL LOW (ref >60)
GLUCOSE: 242 mg/dL — AB (ref 65–99)
Potassium: 3.8 mmol/L (ref 3.5–5.1)
SODIUM: 141 mmol/L (ref 135–145)

## 2015-01-19 LAB — PARATHYROID HORMONE, INTACT (NO CA): PTH: 133 pg/mL — ABNORMAL HIGH (ref 15–65)

## 2015-01-19 MED ORDER — SEVELAMER CARBONATE 800 MG PO TABS
800.0000 mg | ORAL_TABLET | Freq: Three times a day (TID) | ORAL | Status: DC
  Administered 2015-01-19 – 2015-01-24 (×16): 800 mg via ORAL
  Filled 2015-01-19 (×16): qty 1

## 2015-01-19 MED ORDER — FUROSEMIDE 10 MG/ML IJ SOLN
5.0000 mg/h | INTRAVENOUS | Status: DC
  Administered 2015-01-19 – 2015-01-20 (×2): 10 mg/h via INTRAVENOUS
  Filled 2015-01-19 (×2): qty 25

## 2015-01-19 MED ORDER — INSULIN ASPART 100 UNIT/ML ~~LOC~~ SOLN
0.0000 [IU] | Freq: Three times a day (TID) | SUBCUTANEOUS | Status: DC
  Administered 2015-01-19: 3 [IU] via SUBCUTANEOUS
  Administered 2015-01-20: 2 [IU] via SUBCUTANEOUS
  Administered 2015-01-21: 1 [IU] via SUBCUTANEOUS
  Administered 2015-01-21: 2 [IU] via SUBCUTANEOUS
  Administered 2015-01-21: 1 [IU] via SUBCUTANEOUS
  Administered 2015-01-21: 2 [IU] via SUBCUTANEOUS
  Administered 2015-01-22: 1 [IU] via SUBCUTANEOUS
  Administered 2015-01-22 (×2): 2 [IU] via SUBCUTANEOUS
  Administered 2015-01-23: 3 [IU] via SUBCUTANEOUS
  Administered 2015-01-23 – 2015-01-24 (×4): 2 [IU] via SUBCUTANEOUS
  Filled 2015-01-19 (×3): qty 3
  Filled 2015-01-19: qty 1
  Filled 2015-01-19 (×4): qty 2
  Filled 2015-01-19: qty 1
  Filled 2015-01-19 (×4): qty 2
  Filled 2015-01-19: qty 1

## 2015-01-19 MED ORDER — IPRATROPIUM-ALBUTEROL 0.5-2.5 (3) MG/3ML IN SOLN
3.0000 mL | Freq: Four times a day (QID) | RESPIRATORY_TRACT | Status: DC | PRN
  Administered 2015-01-20: 3 mL via RESPIRATORY_TRACT
  Filled 2015-01-19: qty 3

## 2015-01-19 MED ORDER — INSULIN ASPART 100 UNIT/ML ~~LOC~~ SOLN: 0.0000 [IU] | Freq: Three times a day (TID) | SUBCUTANEOUS | Status: DC

## 2015-01-19 NOTE — Progress Notes (Signed)
Pt nighttime blood sugar - 271. No nighttime coverage ordered. MD Anne HahnWillis notified. Order to change sliding scale insulin to include bedtime dosage. Will continue to monitor.

## 2015-01-19 NOTE — Progress Notes (Signed)
Patient ID: Grady Mohabir, male   DOB: 1940/10/21, 74 y.o.   MRN: [id number] [facility] PROGRESS NOTE  PCP: Donell Sievert, MD  HPI/Subjective: Patient is breathing much better today. Yesterday had a be transferred to the ICU for heart failure. He is resting comfortably. Nephrology put on a Lasix drip. Still has a little bit of shortness of breath and cough but feeling much better.  Objective: Filed Vitals:   01/19/15 1100 01/19/15 1200  BP: 155/85 151/81  Pulse: 79 83  Temp:    Resp: 22 20    Filed Weights   01/18/15 0423 01/18/15 1515 01/19/15 0625  Weight: 112.084 kg (247 lb 1.6 oz) 112.5 kg (248 lb 0.3 oz) 108.5 kg (239 lb 3.2 oz)    ROS: Review of Systems  Constitutional: Negative for fever and chills.  Eyes: Negative for blurred vision.  Respiratory: Positive for cough and shortness of breath. Negative for wheezing.   Cardiovascular: Negative for chest pain.  Gastrointestinal: Negative for nausea, vomiting, abdominal pain, diarrhea and constipation.  Genitourinary: Negative for dysuria.  Musculoskeletal: Negative for joint pain.  Neurological: Negative for dizziness and headaches.   Exam: Physical Exam  Constitutional: He is oriented to person, place, and time.  HENT:  Nose: No mucosal edema.  Mouth/Throat: No oropharyngeal exudate or posterior oropharyngeal edema.  Eyes: Conjunctivae, EOM and lids are normal. Pupils are equal, round, and reactive to light.  Neck: No JVD present. Carotid bruit is not present. No thyroid mass and no thyromegaly present.  Cardiovascular: S1 normal and S2 normal.  Exam reveals no gallop.   Murmur heard.  Systolic murmur is present with a grade of 2/6  Pulses:      Dorsalis pedis pulses are 2+ on the right side, and 2+ on the left side.  Respiratory: No respiratory distress. He has decreased breath sounds in the right lower field and the left lower field. He has no wheezes. He has no rhonchi. He has rales in the right lower  field and the left lower field.  GI: Soft. Bowel sounds are normal. He exhibits distension. There is no tenderness.  Musculoskeletal:       Right ankle: He exhibits swelling.       Left ankle: He exhibits swelling.  Lymphadenopathy:    He has no cervical adenopathy.  Neurological: He is alert and oriented to person, place, and time. No cranial nerve deficit.  Skin: Skin is warm. No rash noted. Nails show no clubbing.  Psychiatric: He has a normal mood and affect.    Data Reviewed: Basic Metabolic Panel:  Recent Labs Lab 01/16/15 2031 01/17/15 0538 01/18/15 0518 01/18/15 1517 01/19/15 0544  NA 140 142 143 140 141  K 3.3* 3.5 3.2* 3.8 3.8  CL 105 108 109 104 105  CO2 GLUCOSE 386* 256* 103* 159* 242*  BUN 52* 53* 68* 68* 72*  CREATININE 4.60* 4.95* 5.36* 4.99* 4.93*  CALCIUM 6.4* 6.6* 6.4* 6.6* 6.3*  MG 1.4*  --   --   --   --   PHOS  --   --  5.9*  --   --    Liver Function Tests:  Recent Labs Lab 01/16/15 2031  AST 22  ALT 11*  ALKPHOS 81  BILITOT 0.3  PROT 6.5  ALBUMIN 2.4*   CBC:  Recent Labs Lab 01/16/15 2031 01/17/15 0538 01/19/15 0544  WBC 10.3 7.9 10.9*  NEUTROABS 9.0*  --   --   HGB  8.2* 7.5* 7.5*  HCT 24.6* 22.3* 22.7*  MCV 85.1 84.5 84.4  PLT 277 244 238   Cardiac Enzymes:  Recent Labs Lab 01/16/15 2031 01/18/15 1517  TROPONINI <0.03 0.03   BNP (last 3 results)  Recent Labs  01/16/15 2031  BNP 522.0*   Studies: Dg Chest 1 View  01/18/2015  CLINICAL DATA:  Shortness of breath. Extremity swelling and Kidney disease. EXAM: CHEST 1 VIEW COMPARISON:  01/17/2015 FINDINGS: Lungs are adequately inflated and demonstrate bilateral perihilar opacification with patchy opacification over the right upper lobe with slight overall worsening. Suggestion of stable small bilateral pleural effusions. Stable cardiomegaly. Remainder of the exam is unchanged. IMPRESSION: Overall worsening of bilateral perihilar opacification with patchy  opacification over the right upper lobe and right base. Findings likely due to interstitial edema, although cannot exclude infection. Cardiomegaly.  Probable small bilateral pleural effusions. Electronically Signed   By: Elberta Fortisaniel  Boyle M.D.   On: 01/18/2015 15:16    Scheduled Meds: . amLODipine  10 mg Oral Daily  . antiseptic oral rinse  7 mL Mouth Rinse BID  . aspirin  81 mg Oral Daily  . azithromycin  250 mg Oral Daily  . budesonide (PULMICORT) nebulizer solution  0.25 mg Nebulization BID  . cefTRIAXone (ROCEPHIN)  IV  1 g Intravenous Q24H  . furosemide  120 mg Intravenous Once  . glipiZIDE  5 mg Oral QAC breakfast  . heparin  5,000 Units Subcutaneous 3 times per day  . hydrALAZINE  10 mg Oral 4 times per day  . insulin aspart  0-9 Units Subcutaneous TID WC  . ipratropium-albuterol  3 mL Nebulization Q6H  . metoprolol tartrate  50 mg Oral BID  . sevelamer carbonate  800 mg Oral TID WC    Assessment/Plan:  1. Acute respiratory failure with hypoxia - yesterday the patient required 100% nonrebreather initially on transfer to the ICU. Patient now down to 5 L and we just dialed down to 3 L and will monitor pulse oximetry.  2. Acute diastolic congestive heart failure and fluid overload. Recent echocardiogram showed an EF of 55-60%. Patient is on low-dose metoprolol. Nephrology place the patient on Lasix drip. 3. Acute renal failure on chronic kidney disease stage IV. Current creatinine is 4.93. Watch closely with diuresis. 4. Anemia of chronic disease- continue to monitor hemoglobin 5. Wheeze- improved. Discontinue Solu-Medrol. Continue budesonide nebulizers. Antibiotics started because chest x-ray could not rule out infection but I think this is more likely to be heart failure. 6. Type 2 diabetes mellitus- sliding scale and low-dose glipizide.  Code Status:     Code Status Orders        Start     Ordered   01/16/15 2304  Full code   Continuous     01/16/15 2303     Disposition  Plan: To be determined, case discussed with daughter on phone  Consultants:  Nephrology  Time spent: 25 minutes  Alford HighlandWIETING, Joneric Streight  [facility]

## 2015-01-19 NOTE — Progress Notes (Signed)
[facility]  ROUNDING NOTE   Subjective:  The patient had worsening of status yesterday. Next line developed malignant hypertension and subsequent hypoxic respiratory failure. Was transitioned to the critical care unit. This morning he appears to be improved and is on nasal cannula. Had good urine output of 3.7 L over the past 3 shifts.    Objective:  Vital signs in last 24 hours:  Temp:  [98.2 F (36.8 C)-98.7 F (37.1 C)] 98.7 F (37.1 C) (11/27 0400) Pulse Rate:  [74-97] 77 (11/27 0600) Resp:  [18-29] 19 (11/27 0600) BP: (133-208)/(67-103) 148/75 mmHg (11/27 0600) SpO2:  [89 %-100 %] 92 % (11/27 0600) FiO2 (%):  [100 %] 100 % (11/26 1700) Weight:  [108.5 kg (239 lb 3.2 oz)-112.5 kg (248 lb 0.3 oz)] 108.5 kg (239 lb 3.2 oz) (11/27 0625)  Weight change: 0.416 kg (14.7 oz) Filed Weights   01/18/15 0423 01/18/15 1515 01/19/15 0625  Weight: 112.084 kg (247 lb 1.6 oz) 112.5 kg (248 lb 0.3 oz) 108.5 kg (239 lb 3.2 oz)    Intake/Output: I/O last 3 completed shifts: In: 1272 [P.O.:1160; IV Piggyback:112] Out: 3700 [Urine:3700]   Intake/Output this shift:     Physical Exam: General: NAD, resting in bed  Head: Normocephalic, atraumatic. Moist oral mucosal membranes  Eyes: Anicteric  Neck: Supple, trachea midline  Lungs:  Basilar rales, normal effort  Heart: Regular rate and rhythm  Abdomen:  Soft, nontender, BS present  Extremities: 1+ peripheral edema.  Neurologic: Nonfocal, moving all four extremities  Skin: No lesions  Access: none    Basic Metabolic Panel:  Recent Labs Lab 01/16/15 2031 01/17/15 0538 01/18/15 0518 01/18/15 1517 01/19/15 0544  NA 140 142 143 140 141  K 3.3* 3.5 3.2* 3.8 3.8  CL 105 108 109 104 105  CO2 GLUCOSE 386* 256* 103* 159* 242*  BUN 52* 53* 68* 68* 72*  CREATININE 4.60* 4.95* 5.36* 4.99* 4.93*  CALCIUM 6.4* 6.6* 6.4* 6.6* 6.3*  MG 1.4*  --   --   --   --   PHOS  --   --  5.9*  --   --     Liver  Function Tests:  Recent Labs Lab 01/16/15 2031  AST 22  ALT 11*  ALKPHOS 81  BILITOT 0.3  PROT 6.5  ALBUMIN 2.4*   No results for input(s): LIPASE, AMYLASE in the last 168 hours. No results for input(s): AMMONIA in the last 168 hours.  CBC:  Recent Labs Lab 01/16/15 2031 01/17/15 0538 01/19/15 0544  WBC 10.3 7.9 10.9*  NEUTROABS 9.0*  --   --   HGB 8.2* 7.5* 7.5*  HCT 24.6* 22.3* 22.7*  MCV 85.1 84.5 84.4  PLT 277 244 238    Cardiac Enzymes:  Recent Labs Lab 01/16/15 2031 01/18/15 1517  TROPONINI <0.03 0.03    BNP: Invalid input(s): POCBNP  CBG:  Recent Labs Lab 01/18/15 0753 01/18/15 1122 01/18/15 1614 01/18/15 2134 01/19/15 0703  GLUCAP 88 132* 151* 257* 231*    Microbiology: Results for orders placed or performed during the hospital encounter of 01/16/15  MRSA PCR Screening     Status: None   Collection Time: 01/18/15  3:10 PM  Result Value Ref Range Status   MRSA by PCR NEGATIVE NEGATIVE Final    Comment:        The GeneXpert MRSA Assay (FDA approved for NASAL specimens only), is one component of a comprehensive MRSA colonization surveillance program.  It is not intended to diagnose MRSA infection nor to guide or monitor treatment for MRSA infections.     Coagulation Studies: No results for input(s): LABPROT, INR in the last 72 hours.  Urinalysis: No results for input(s): COLORURINE, LABSPEC, PHURINE, GLUCOSEU, HGBUR, BILIRUBINUR, KETONESUR, PROTEINUR, UROBILINOGEN, NITRITE, LEUKOCYTESUR in the last 72 hours.  Invalid input(s): APPERANCEUR    Imaging: Dg Chest 1 View  01/18/2015  CLINICAL DATA:  Shortness of breath. Extremity swelling and Kidney disease. EXAM: CHEST 1 VIEW COMPARISON:  01/17/2015 FINDINGS: Lungs are adequately inflated and demonstrate bilateral perihilar opacification with patchy opacification over the right upper lobe with slight overall worsening. Suggestion of stable small bilateral pleural effusions. Stable  cardiomegaly. Remainder of the exam is unchanged. IMPRESSION: Overall worsening of bilateral perihilar opacification with patchy opacification over the right upper lobe and right base. Findings likely due to interstitial edema, although cannot exclude infection. Cardiomegaly.  Probable small bilateral pleural effusions. Electronically Signed   By: Elberta Fortisaniel  Boyle M.D.   On: 01/18/2015 15:16     Medications:     . amLODipine  10 mg Oral Daily  . antiseptic oral rinse  7 mL Mouth Rinse BID  . aspirin  81 mg Oral Daily  . azithromycin  250 mg Oral Daily  . budesonide (PULMICORT) nebulizer solution  0.25 mg Nebulization BID  . cefTRIAXone (ROCEPHIN)  IV  1 g Intravenous Q24H  . furosemide  120 mg Intravenous Once  . furosemide  120 mg Intravenous BID  . glipiZIDE  5 mg Oral QAC breakfast  . heparin  5,000 Units Subcutaneous 3 times per day  . hydrALAZINE  10 mg Oral 4 times per day  . insulin aspart  0-9 Units Subcutaneous TID WC  . ipratropium-albuterol  3 mL Nebulization Q6H  . methylPREDNISolone (SOLU-MEDROL) injection  40 mg Intravenous Q12H  . metoprolol tartrate  50 mg Oral BID     Assessment/ Plan:  74 y.o. 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 01[facility]16/2015 for evaluation of shortness of breath.   1. Acute renal failure/chronic kidney disease stage IV/proteinuria. [facility]. [facility] A[facility], [facility]. Was to have outpt renal biopsy but cancelled due to death in the family. -BUN is worsening as expected given use of diuretics however creatinine appears to be relatively stable. Good urine output of 3.7 L over the past 24 hours. Patient's shortness of breath has improved also. Therefore no  urgent indication to start dialysis immediately. We will plan to place the patient on a Lasix drip today.  2. Acute diastolic heart failure. Switch over to Lasix drip as above.  3. Anemia of chronic kidney disease.  Hemoglobin currently 7.5. May need to consider use of Epogen.  4. Secondary hyperparathyroidism:  Phosphorus high at 5.9. We will start the patient on Renvela 800 mg by mouth 3 times a day.   LOS: 3 Tommey Barret 11/27/20168:19 AM

## 2015-01-20 LAB — BASIC METABOLIC PANEL
Anion gap: 7 (ref 5–15)
BUN: 81 mg/dL — ABNORMAL HIGH (ref 6–20)
CALCIUM: 6.2 mg/dL — AB (ref 8.9–10.3)
CO2: 28 mmol/L (ref 22–32)
CREATININE: 5.25 mg/dL — AB (ref 0.61–1.24)
Chloride: 107 mmol/L (ref 101–111)
GFR calc non Af Amer: 10 mL/min — ABNORMAL LOW (ref >60)
GFR, EST AFRICAN AMERICAN: 11 mL/min — AB (ref >60)
Glucose, Bld: 121 mg/dL — ABNORMAL HIGH (ref 65–99)
Potassium: 3.4 mmol/L — ABNORMAL LOW (ref 3.5–5.1)
SODIUM: 142 mmol/L (ref 135–145)

## 2015-01-20 LAB — GLUCOSE, CAPILLARY
GLUCOSE-CAPILLARY: 159 mg/dL — AB (ref 65–99)
Glucose-Capillary: 101 mg/dL — ABNORMAL HIGH (ref 65–99)
Glucose-Capillary: 117 mg/dL — ABNORMAL HIGH (ref 65–99)
Glucose-Capillary: 119 mg/dL — ABNORMAL HIGH (ref 65–99)

## 2015-01-20 LAB — HEMOGLOBIN: Hemoglobin: 6.5 g/dL — ABNORMAL LOW (ref 13.0–18.0)

## 2015-01-20 LAB — MAGNESIUM: MAGNESIUM: 1.6 mg/dL — AB (ref 1.7–2.4)

## 2015-01-20 MED ORDER — SENNOSIDES-DOCUSATE SODIUM 8.6-50 MG PO TABS
2.0000 | ORAL_TABLET | Freq: Every day | ORAL | Status: DC
  Administered 2015-01-20 – 2015-01-23 (×4): 2 via ORAL
  Filled 2015-01-20 (×4): qty 2

## 2015-01-20 MED ORDER — POTASSIUM CHLORIDE CRYS ER 20 MEQ PO TBCR
40.0000 meq | EXTENDED_RELEASE_TABLET | Freq: Once | ORAL | Status: AC
  Administered 2015-01-20: 40 meq via ORAL
  Filled 2015-01-20: qty 2

## 2015-01-20 NOTE — Progress Notes (Signed)
Patient ID: Kevin Singleton, male   DOB: [birth year], 74 y.o.   MRN: [id number] [facility] PROGRESS NOTE  PCP: Donell Sievert, MD  HPI/Subjective: Patient breathing better. Still a little cough and some shortness of breath. Some constipation.  Objective: Filed Vitals:   01/20/15 0820 01/20/15 1117  BP: 141/77 136/74  Pulse: 78 71  Temp: 98.1 F (36.7 C) 98.9 F (37.2 C)  Resp: 18 18    Filed Weights   01/18/15 1515 01/19/15 0625 01/20/15 0500  Weight: 112.5 kg (248 lb 0.3 oz) 108.5 kg (239 lb 3.2 oz) 105.376 kg (232 lb 5 oz)    ROS: Review of Systems  Constitutional: Negative for fever and chills.  Eyes: Negative for blurred vision.  Respiratory: Positive for cough and shortness of breath. Negative for wheezing.   Cardiovascular: Negative for chest pain.  Gastrointestinal: Positive for constipation. Negative for nausea, vomiting, abdominal pain and diarrhea.  Genitourinary: Negative for dysuria.  Musculoskeletal: Negative for joint pain.  Neurological: Negative for dizziness and headaches.   Exam: Physical Exam  Constitutional: He is oriented to person, place, and time.  HENT:  Nose: No mucosal edema.  Mouth/Throat: No oropharyngeal exudate or posterior oropharyngeal edema.  Eyes: Conjunctivae, EOM and lids are normal. Pupils are equal, round, and reactive to light.  Neck: No JVD present. Carotid bruit is not present. No thyroid mass and no thyromegaly present.  Cardiovascular: S1 normal and S2 normal.  Exam reveals no gallop.   Murmur heard.  Systolic murmur is present with a grade of 2/6  Pulses:      Dorsalis pedis pulses are 2+ on the right side, and 2+ on the left side.  Respiratory: No respiratory distress. He has no decreased breath sounds. He has no wheezes. He has no rhonchi. He has rales in the right lower field and the left lower field.  GI: Soft. Bowel sounds are normal. He exhibits distension. There is no tenderness.  Musculoskeletal:       Right  ankle: He exhibits swelling.       Left ankle: He exhibits swelling.  Lymphadenopathy:    He has no cervical adenopathy.  Neurological: He is alert and oriented to person, place, and time. No cranial nerve deficit.  Skin: Skin is warm. No rash noted. Nails show no clubbing.  Psychiatric: He has a normal mood and affect.    Data Reviewed: Basic Metabolic Panel:  Recent Labs Lab 01/16/15 2031 01/17/15 0538 01/18/15 0518 01/18/15 1517 01/19/15 0544 01/20/15 0517  NA 140 142 143 140 141 142  K 3.3* 3.5 3.2* 3.8 3.8 3.4*  CL 105 108 109 104 105 107  CO2 GLUCOSE 386* 256* 103* 159* 242* 121*  BUN 52* 53* 68* 68* 72* 81*  CREATININE 4.60* 4.95* 5.36* 4.99* 4.93* 5.25*  CALCIUM 6.4* 6.6* 6.4* 6.6* 6.3* 6.2*  MG 1.4*  --   --   --   --  1.6*  PHOS  --   --  5.9*  --   --   --    Liver Function Tests:  Recent Labs Lab 01/16/15 2031  AST 22  ALT 11*  ALKPHOS 81  BILITOT 0.3  PROT 6.5  ALBUMIN 2.4*   CBC:  Recent Labs Lab 01/16/15 2031 01/17/15 0538 01/19/15 0544 01/20/15 0517  WBC 10.3 7.9 10.9*  --   NEUTROABS 9.0*  --   --   --   HGB 8.2* 7.5* 7.5* 6.5*  HCT 24.6*  22.3* 22.7*  --   MCV 85.1 84.5 84.4  --   PLT 277 244 238  --    Cardiac Enzymes:  Recent Labs Lab 01/16/15 2031 01/18/15 1517  TROPONINI <0.03 0.03   BNP (last 3 results)  Recent Labs  01/16/15 2031  BNP 522.0*    Scheduled Meds: . amLODipine  10 mg Oral Daily  . antiseptic oral rinse  7 mL Mouth Rinse BID  . aspirin  81 mg Oral Daily  . azithromycin  250 mg Oral Daily  . budesonide (PULMICORT) nebulizer solution  0.25 mg Nebulization BID  . cefTRIAXone (ROCEPHIN)  IV  1 g Intravenous Q24H  . furosemide  120 mg Intravenous Once  . glipiZIDE  5 mg Oral QAC breakfast  . heparin  5,000 Units Subcutaneous 3 times per day  . hydrALAZINE  10 mg Oral 4 times per day  . insulin aspart  0-9 Units Subcutaneous TID AC & HS  . metoprolol tartrate  50 mg Oral BID  .  senna-docusate  2 tablet Oral QHS  . sevelamer carbonate  800 mg Oral TID WC    Assessment/Plan:  1. Acute respiratory failure with hypoxia - down to 2 L 2. Acute diastolic congestive heart failure and fluid overload. Recent echocardiogram showed an EF of 55-60%. Patient is on low-dose metoprolol. Nephrology still has on low-dose Lasix drip 5 mg an hour 3. Acute renal failure on chronic kidney disease stage IV. Current creatinine is 5.25. Watch closely with diuresis. 4. Anemia of chronic disease- continue to monitor hemoglobin 5. Wheeze- improved. Likely all cardiac wheeze. Discontinue budesonide nebulizers and antibiotics.  6. Type 2 diabetes mellitus- sliding scale and low-dose glipizide. 7. Hypokalemia- 1 dose of potassium today  Code Status:     Code Status Orders        Start     Ordered   01/16/15 2304  Full code   Continuous     01/16/15 2303     Disposition Plan: To be determined, case discussed with daughter on phone Yesterday  Consultants:  Nephrology  Time spent: 20 minutes  Alford HighlandWIETING, [place] Teters  [facility]

## 2015-01-20 NOTE — Progress Notes (Signed)
[facility]  ROUNDING NOTE   Subjective:  The patient remains on a Lasix drip at present. Urine output over the past 3 shifts has been 1.9 L. However her BUN and creatinine rising and currently 81 and 5.5 respectively. Hemoglobin also low at 6.5 today. Patient reports overall that his shortness of breath has improved. He has lost 7 kg since admission.    Objective:  Vital signs in last 24 hours:  Temp:  [98.1 F (36.7 C)-99.1 F (37.3 C)] 98.9 F (37.2 C) (11/28 1117) Pulse Rate:  [71-80] 71 (11/28 1117) Resp:  [18-21] 18 (11/28 1117) BP: (131-150)/(69-79) 136/74 mmHg (11/28 1117) SpO2:  [93 %-97 %] 95 % (11/28 1117) Weight:  [105.376 kg (232 lb 5 oz)] 105.376 kg (232 lb 5 oz) (11/28 0500)  Weight change: -7.124 kg (-15 lb 11.3 oz) Filed Weights   01/18/15 1515 01/19/15 0625 01/20/15 0500  Weight: 112.5 kg (248 lb 0.3 oz) 108.5 kg (239 lb 3.2 oz) 105.376 kg (232 lb 5 oz)    Intake/Output: I/O last 3 completed shifts: In: 673 [P.O.:480; I.V.:81; IV Piggyback:112] Out: 1975 [Urine:1975]   Intake/Output this shift:  Total I/O In: 480 [P.O.:480] Out: 2000 [Urine:2000]  Physical Exam: General: NAD, resting in bed  Head: Normocephalic, atraumatic. Moist oral mucosal membranes  Eyes: Anicteric  Neck: Supple, trachea midline  Lungs:  Basilar rales, normal effort  Heart: Regular rate and rhythm  Abdomen:  Soft, nontender, BS present  Extremities: 1+ peripheral edema.  Neurologic: Nonfocal, moving all four extremities  Skin: No lesions  Access: none    Basic Metabolic Panel:  Recent Labs Lab 01/16/15 2031 01/17/15 0538 01/18/15 0518 01/18/15 1517 01/19/15 0544 01/20/15 0517  NA 140 142 143 140 141 142  K 3.3* 3.5 3.2* 3.8 3.8 3.4*  CL 105 108 109 104 105 107  CO2 GLUCOSE 386* 256* 103* 159* 242* 121*  BUN 52* 53* 68* 68* 72* 81*  CREATININE 4.60* 4.95* 5.36* 4.99* 4.93* 5.25*  CALCIUM 6.4* 6.6* 6.4* 6.6* 6.3* 6.2*  MG  1.4*  --   --   --   --  1.6*  PHOS  --   --  5.9*  --   --   --     Liver Function Tests:  Recent Labs Lab 01/16/15 2031  AST 22  ALT 11*  ALKPHOS 81  BILITOT 0.3  PROT 6.5  ALBUMIN 2.4*   No results for input(s): LIPASE, AMYLASE in the last 168 hours. No results for input(s): AMMONIA in the last 168 hours.  CBC:  Recent Labs Lab 01/16/15 2031 01/17/15 0538 01/19/15 0544 01/20/15 0517  WBC 10.3 7.9 10.9*  --   NEUTROABS 9.0*  --   --   --   HGB 8.2* 7.5* 7.5* 6.5*  HCT 24.6* 22.3* 22.7*  --   MCV 85.1 84.5 84.4  --   PLT 277 244 238  --     Cardiac Enzymes:  Recent Labs Lab 01/16/15 2031 01/18/15 1517  TROPONINI <0.03 0.03    BNP: Invalid input(s): POCBNP  CBG:  Recent Labs Lab 01/19/15 1624 01/19/15 2125 01/19/15 2254 01/20/15 0724 01/20/15 1119  GLUCAP 200* 271* 242* 101* 159*    Microbiology: Results for orders placed or performed during the hospital encounter of 01/16/15  MRSA PCR Screening     Status: None   Collection Time: 01/18/15  3:10 PM  Result Value Ref Range Status   MRSA by PCR  NEGATIVE NEGATIVE Final    Comment:        The GeneXpert MRSA Assay (FDA approved for NASAL specimens only), is one component of a comprehensive MRSA colonization surveillance program. It is not intended to diagnose MRSA infection nor to guide or monitor treatment for MRSA infections.     Coagulation Studies: No results for input(s): LABPROT, INR in the last 72 hours.  Urinalysis: No results for input(s): COLORURINE, LABSPEC, PHURINE, GLUCOSEU, HGBUR, BILIRUBINUR, KETONESUR, PROTEINUR, UROBILINOGEN, NITRITE, LEUKOCYTESUR in the last 72 hours.  Invalid input(s): APPERANCEUR    Imaging: Dg Chest 1 View  01/18/2015  CLINICAL DATA:  Shortness of breath. Extremity swelling and Kidney disease. EXAM: CHEST 1 VIEW COMPARISON:  01/17/2015 FINDINGS: Lungs are adequately inflated and demonstrate bilateral perihilar opacification with patchy  opacification over the right upper lobe with slight overall worsening. Suggestion of stable small bilateral pleural effusions. Stable cardiomegaly. Remainder of the exam is unchanged. IMPRESSION: Overall worsening of bilateral perihilar opacification with patchy opacification over the right upper lobe and right base. Findings likely due to interstitial edema, although cannot exclude infection. Cardiomegaly.  Probable small bilateral pleural effusions. Electronically Signed   By: Elberta Fortisaniel  Boyle M.D.   On: 01/18/2015 15:16     Medications:   . furosemide (LASIX) infusion 10 mg/hr (01/20/15 1353)   . amLODipine  10 mg Oral Daily  . antiseptic oral rinse  7 mL Mouth Rinse BID  . aspirin  81 mg Oral Daily  . azithromycin  250 mg Oral Daily  . budesonide (PULMICORT) nebulizer solution  0.25 mg Nebulization BID  . cefTRIAXone (ROCEPHIN)  IV  1 g Intravenous Q24H  . furosemide  120 mg Intravenous Once  . glipiZIDE  5 mg Oral QAC breakfast  . heparin  5,000 Units Subcutaneous 3 times per day  . hydrALAZINE  10 mg Oral 4 times per day  . insulin aspart  0-9 Units Subcutaneous TID AC & HS  . metoprolol tartrate  50 mg Oral BID  . sevelamer carbonate  800 mg Oral TID WC     Assessment/ Plan:  74 y.o. 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   1. Acute renal failure/chronic kidney disease stage IV/proteinuria. [facility]. [facility] of [facility] ANA, [facility]appa[facility]. Was to have outpt renal biopsy but cancelled due to death in the family. -Renal function appears to be worsening under the influence of Lasix.  We will decrease Lasix dose to 5 mg per hour as he has diuresed  approximately 14 pounds since admission.  2. Acute diastolic heart failure. Decrease Lasix drip to 5 mg per hour..  3. Anemia of chronic kidney disease.  Hemoglobin down to 6.5.  Considerblood transfusion today however defer this to hospitalist.  4. Secondary hyperparathyroidism:  continue Renvela 800 mg by mouth 3 times a day and continue to periodically monitor CBC.c   LOS: 4 Shareta Fishbaugh 11/28/20162:36 PM

## 2015-01-20 NOTE — Care Management Important Message (Signed)
Important Message  Patient Details  Name: Clelia SchaumannLee Swatek MRN: [id number] Date of Birth: 1940/08/16   Medicare Important Message Given:  Yes    Olegario MessierKathy A Glorimar Stroope 01/20/2015, 10:13 AM

## 2015-01-20 NOTE — Care Management (Signed)
Patient still on acute O2.  [facility].  Patient was discharged from their services 11/29/14.  If home health services required at time of discharge patient would [facility].

## 2015-01-20 NOTE — Progress Notes (Signed)
PT Cancellation Note  Patient Details Name: Kevin Singleton MRN: [id number] DOB: 07/27/1940   Cancelled Treatment:    Reason Eval/Treat Not Completed: Medical issues which prohibited therapy (Per chart review, patient noted with transfer to CCU (now returned to telemetry unit) since initial evaluation; order for resumption/continuation of PT received.  Chart reviewed for attempted treatment session, but noted with HgB 6.5, contraindicated for PT at this time.  Will re-attempt at later time/date as medically appropriate.)   Analena Gama H. Manson PasseyBrown, PT, DPT, NCS 01/20/2015, 10:09 AM [phone]

## 2015-01-21 LAB — HEMOGLOBIN: Hemoglobin: 7.7 g/dL — ABNORMAL LOW (ref 13.0–18.0)

## 2015-01-21 LAB — RENAL FUNCTION PANEL
ANION GAP: 11 (ref 5–15)
Albumin: 2.2 g/dL — ABNORMAL LOW (ref 3.5–5.0)
BUN: 78 mg/dL — ABNORMAL HIGH (ref 6–20)
CHLORIDE: 104 mmol/L (ref 101–111)
CO2: 27 mmol/L (ref 22–32)
Calcium: 6.3 mg/dL — CL (ref 8.9–10.3)
Creatinine, Ser: 4.6 mg/dL — ABNORMAL HIGH (ref 0.61–1.24)
GFR calc non Af Amer: 11 mL/min — ABNORMAL LOW (ref >60)
GFR, EST AFRICAN AMERICAN: 13 mL/min — AB (ref >60)
GLUCOSE: 160 mg/dL — AB (ref 65–99)
Phosphorus: 5.5 mg/dL — ABNORMAL HIGH (ref 2.5–4.6)
Potassium: 3.4 mmol/L — ABNORMAL LOW (ref 3.5–5.1)
SODIUM: 142 mmol/L (ref 135–145)

## 2015-01-21 LAB — GLUCOSE, CAPILLARY
GLUCOSE-CAPILLARY: 165 mg/dL — AB (ref 65–99)
GLUCOSE-CAPILLARY: 140 mg/dL — AB (ref 65–99)
GLUCOSE-CAPILLARY: 163 mg/dL — AB (ref 65–99)
Glucose-Capillary: 138 mg/dL — ABNORMAL HIGH (ref 65–99)

## 2015-01-21 LAB — ABO/RH: ABO/RH(D): O POS

## 2015-01-21 MED ORDER — HYDRALAZINE HCL 25 MG PO TABS
25.0000 mg | ORAL_TABLET | Freq: Four times a day (QID) | ORAL | Status: DC
  Administered 2015-01-21 – 2015-01-24 (×13): 25 mg via ORAL
  Filled 2015-01-21 (×13): qty 1

## 2015-01-21 MED ORDER — MAGNESIUM OXIDE 400 (241.3 MG) MG PO TABS
400.0000 mg | ORAL_TABLET | Freq: Two times a day (BID) | ORAL | Status: DC
  Administered 2015-01-21 – 2015-01-24 (×7): 400 mg via ORAL
  Filled 2015-01-21 (×8): qty 1

## 2015-01-21 NOTE — Clinical Social Work Note (Signed)
Clinical Social Work Assessment  Patient Details  Name: Clelia SchaumannLee Rollison MRN: [id number] Date of Birth: 04/22/1940  Date of referral:  01/21/15               Reason for consult:  Facility Placement                Permission sought to share information with:   (patient confused this afternoon) Permission granted to share information::     Name::        Agency::     Relationship::     Contact Information:     Housing/Transportation Living arrangements for the past 2 months:  Single Family Home Source of Information:  Patient Patient Interpreter Needed:  None Criminal Activity/Legal Involvement Pertinent to Current Situation/Hospitalization:  No - Comment as needed Significant Relationships:  Adult Children Lives with:  Self Do you feel safe going back to the place where you live?   (patient is confused ) Need for family participation in patient care:  No (Coment)  Care giving concerns:  Patient states he lives alone.   Social Worker assessment / plan:  CSW attempted to speak with patient this afternoon regarding PT recommendations for STR for patient. Nurse's aid was coming out of patient's room and stated patient was saying he had to urinate but that he had a catheter. CSW spoke with patient who was very pleasant and willing to speak with CSW. Patient was confused as he thought the year was [id number], [place] and he didn't know the current month. CSW explained the purpose of the CSW visit and asked if I could call one of his children. Patient gave permission for this.   Employment status:  Retired Health and safety inspectornsurance information:  Medicare PT Recommendations:  Skilled Nursing Facility Information / Referral to community resources:  Skilled Nursing Facility  Patient/Family's Response to care:  Patient pleasant and cooperative.  Patient/Family's Understanding of and Emotional Response to Diagnosis, Current Treatment, and Prognosis:  Do not feel at this time that patient is able  to understand his current medical condition. Will attempt to call one of his daughters.  Emotional Assessment Appearance:  Appears stated age Attitude/Demeanor/Rapport:   (pleasant and cooperative) Affect (typically observed):  Calm (confused) Orientation:  Oriented to Self Alcohol / Substance use:  Not Applicable Psych involvement (Current and /or in the community):  No (Comment)  Discharge Needs  Concerns to be addressed:  Care Coordination Readmission within the last 30 days:  No Current discharge risk:  None Barriers to Discharge:  No Barriers Identified   York SpanielMonica Javian Nudd, LCSW 01/21/2015, 3:47 PM

## 2015-01-21 NOTE — Progress Notes (Signed)
Patient ID: Kevin Singleton, male   DOB: 07-31-1940, 74 y.o.   MRN: [id number] [facility] PROGRESS NOTE  PCP: Donell Sievert, MD  HPI/Subjective: Patient breathing better. Offers no complaints. Feeling weak.  Objective: Filed Vitals:   01/21/15 1142 01/21/15 1453  BP: 147/83   Pulse: 82 85  Temp: 100.5 F (38.1 C)   Resp: 18     Filed Weights   01/19/15 0625 01/20/15 0500 01/21/15 0409  Weight: 108.5 kg (239 lb 3.2 oz) 105.376 kg (232 lb 5 oz) 103.692 kg (228 lb 9.6 oz)    ROS: Review of Systems  Constitutional: Negative for fever and chills.  Eyes: Negative for blurred vision.  Respiratory: Positive for cough and shortness of breath. Negative for wheezing.   Cardiovascular: Negative for chest pain.  Gastrointestinal: Positive for constipation. Negative for nausea, vomiting, abdominal pain and diarrhea.  Genitourinary: Negative for dysuria.  Musculoskeletal: Negative for joint pain.  Neurological: Negative for dizziness and headaches.   Exam: Physical Exam  Constitutional: He is oriented to person, place, and time.  HENT:  Nose: No mucosal edema.  Mouth/Throat: No oropharyngeal exudate or posterior oropharyngeal edema.  Eyes: Conjunctivae, EOM and lids are normal. Pupils are equal, round, and reactive to light.  Neck: No JVD present. Carotid bruit is not present. No thyroid mass and no thyromegaly present.  Cardiovascular: S1 normal and S2 normal.  Exam reveals no gallop.   Murmur heard.  Systolic murmur is present with a grade of 2/6  Pulses:      Dorsalis pedis pulses are 2+ on the right side, and 2+ on the left side.  Respiratory: No respiratory distress. He has no decreased breath sounds. He has no wheezes. He has no rhonchi. He has rales in the right lower field and the left lower field.  GI: Soft. Bowel sounds are normal. He exhibits distension. There is no tenderness.  Musculoskeletal:       Right ankle: He exhibits swelling.       Left ankle: He  exhibits swelling.  Lymphadenopathy:    He has no cervical adenopathy.  Neurological: He is alert and oriented to person, place, and time. No cranial nerve deficit.  Skin: Skin is warm. No rash noted. Nails show no clubbing.  Psychiatric: He has a normal mood and affect.    Data Reviewed: Basic Metabolic Panel:  Recent Labs Lab 01/16/15 2031  01/18/15 0518 01/18/15 1517 01/19/15 0544 01/20/15 0517 01/21/15 0705  NA 140  < > 143 140 141 142 142  K 3.3*  < > 3.2* 3.8 3.8 3.4* 3.4*  CL 105  < > 109 104 105 107 104  CO2 25  < > GLUCOSE 386*  < > 103* 159* 242* 121* 160*  BUN 52*  < > 68* 68* 72* 81* 78*  CREATININE 4.60*  < > 5.36* 4.99* 4.93* 5.25* 4.60*  CALCIUM 6.4*  < > 6.4* 6.6* 6.3* 6.2* 6.3*  MG 1.4*  --   --   --   --  1.6*  --   PHOS  --   --  5.9*  --   --   --  5.5*  < > = values in this interval not displayed. Liver Function Tests:  Recent Labs Lab 01/16/15 2031 01/21/15 0705  AST 22  --   ALT 11*  --   ALKPHOS 81  --   BILITOT 0.3  --   PROT 6.5  --   ALBUMIN  2.4* 2.2*   CBC:  Recent Labs Lab 01/16/15 2031 01/17/15 0538 01/19/15 0544 01/20/15 0517 01/21/15 0802  WBC 10.3 7.9 10.9*  --   --   NEUTROABS 9.0*  --   --   --   --   HGB 8.2* 7.5* 7.5* 6.5* 7.7*  HCT 24.6* 22.3* 22.7*  --   --   MCV 85.1 84.5 84.4  --   --   PLT 277 244 238  --   --    Cardiac Enzymes:  Recent Labs Lab 01/16/15 2031 01/18/15 1517  TROPONINI <0.03 0.03   BNP (last 3 results)  Recent Labs  01/16/15 2031  BNP 522.0*    Scheduled Meds: . amLODipine  10 mg Oral Daily  . antiseptic oral rinse  7 mL Mouth Rinse BID  . aspirin  81 mg Oral Daily  . furosemide  120 mg Intravenous Once  . glipiZIDE  5 mg Oral QAC breakfast  . heparin  5,000 Units Subcutaneous 3 times per day  . hydrALAZINE  25 mg Oral 4 times per day  . insulin aspart  0-9 Units Subcutaneous TID AC & HS  . magnesium oxide  400 mg Oral BID  . metoprolol tartrate  50 mg Oral  BID  . senna-docusate  2 tablet Oral QHS  . sevelamer carbonate  800 mg Oral TID WC    Assessment/Plan:  1. Acute respiratory failure with hypoxia - down to 2 L. check pulse ox on room air tomorrow morning 2. Acute diastolic congestive heart failure and fluid overload. Recent echocardiogram showed an EF of 55-60%. Patient is on low-dose metoprolol, Imdur and hydralazine. Nephrology suspended the Lasix drip today. 3. Acute renal failure on chronic kidney disease stage IV. Current creatinine is 4.6 with a GFR of 13. Continue to monitor creatinine daily. 4. Anemia of chronic disease- today's hemoglobin up at 7.7 5. Wheeze- improved. Likely all cardiac wheeze. 6. Type 2 diabetes mellitus- sliding scale and low-dose glipizide. 7. Hypokalemia, hypomagnesemia- replace magnesium orally. Continue to watch potassium levels.  Code Status:     Code Status Orders        Start     Ordered   01/16/15 2304  Full code   Continuous     01/16/15 2303     Disposition Plan: To be determined, case discussed with daughter on phone today  Consultants:  Nephrology  Time spent: 20 minutes  Alford HighlandWIETING, Keyvin Rison  [facility]

## 2015-01-21 NOTE — Evaluation (Signed)
Physical Therapy RE-Evaluation Patient Details Name: Kevin Singleton MRN: [id number] DOB: March 24, 1940 Today's Date: 01/21/2015   History of Present Illness  Patient is a 74 y/o male that presents with increasing shortness of breath and fluid retention; admitted for acute hospitalization secondary to fluid overload.  Hospital course significant for worsening respiratory status requiring transfer to CCU (now returned to floor); orders for continued PT received.  Patient with spontaneous drop in HgB previous date, but slowly recovering without transfusion per chart (now 7.7)  Clinical Impression  Re-evaluation completed status post transfer to CCU for worsening respiratory status.  Patient now on 3L O2 with all activities; maintains sats >93% with minimal exertion.  Patient now globally weak and deconditioned, requiring significant increase in level of assist required to complete all functional activities (mod/max assist +2 for sit/stand and bed/chair with RW). Unsafe to attempt additional gait distance at this time; unsafe for return home alone based on performance today. Would benefit from skilled PT to address above deficits and promote optimal return to PLOF; recommend transition to STR upon discharge from acute hospitalization.  Care management informed/aware of change in discharge plan after re-evaluation.     Follow Up Recommendations SNF    Equipment Recommendations  Rolling walker with 5" wheels    Recommendations for Other Services       Precautions / Restrictions Precautions Precautions: Fall Restrictions Weight Bearing Restrictions: No      Mobility  Bed Mobility Overal bed mobility: Needs Assistance Bed Mobility: Supine to Sit     Supine to sit: Mod assist     General bed mobility comments: hand-over-hand and max verbal cuing to initiate bed mobility this date  Transfers Overall transfer level: Needs assistance Equipment used: Rolling walker (2 wheeled) Transfers: Sit  to/from Stand Sit to Stand: Mod assist;Max assist;+2 physical assistance         General transfer comment: extensive assist required for lift off and standing balance  Ambulation/Gait Ambulation/Gait assistance: Mod assist;Max assist;+2 physical assistance Ambulation Distance (Feet): 5 Feet Assistive device: Rolling walker (2 wheeled)       General Gait Details: very short, shuffling steps; forward flexed posture.  Multi-directional LOB in upright positioning; very limited/no balance reactions appreciated.  Step by step cuing for task comprehension. Unsafe to attempt additional distance this date  Stairs            Wheelchair Mobility    Modified Rankin (Stroke Patients Only)       Balance Overall balance assessment: Needs assistance Sitting-balance support: No upper extremity supported;Feet unsupported;Feet supported Sitting balance-Leahy Scale: Good     Standing balance support: Bilateral upper extremity supported Standing balance-Leahy Scale: Poor                               Pertinent Vitals/Pain Pain Assessment: No/denies pain    Home Living Family/patient expects to be discharged to:: Private residence Living Arrangements: Alone   Type of Home: House Home Access: Stairs to enter   Entergy CorporationEntrance Stairs-Number of Steps: 1 Home Layout: One level Home Equipment: Environmental consultantWalker - 2 wheels;Cane - single point      Prior Function Level of Independence: Independent with assistive device(s)         Comments: Patient reports he has been ambulating independently with RW or cane.     Hand Dominance        Extremity/Trunk Assessment   Upper Extremity Assessment: Generalized weakness  Lower Extremity Assessment: Generalized weakness (grossly 3- to 3/5 throughout)         Communication   Communication: No difficulties  Cognition Arousal/Alertness: Lethargic Behavior During Therapy: WFL for tasks assessed/performed Overall  Cognitive Status: Difficult to assess (patient fatigued, speech slightly mumbled at times)                      General Comments      Exercises        Assessment/Plan    PT Assessment Patient needs continued PT services  PT Diagnosis Difficulty walking;Generalized weakness   PT Problem List Decreased strength;Cardiopulmonary status limiting activity;Decreased activity tolerance;Decreased balance;Decreased mobility;Decreased safety awareness;Decreased knowledge of precautions;Decreased knowledge of use of DME  PT Treatment Interventions DME instruction;Therapeutic activities;Therapeutic exercise;Gait training;Balance training;Functional mobility training;Patient/family education   PT Goals (Current goals can be found in the Care Plan section) Acute Rehab PT Goals Patient Stated Goal: To return home soon-per previous evaluation PT Goal Formulation: With patient Time For Goal Achievement: 02/04/15 Potential to Achieve Goals: Good    Frequency Min 2X/week   Barriers to discharge Decreased caregiver support      Co-evaluation               End of Session Equipment Utilized During Treatment: Gait belt;Oxygen Activity Tolerance: Patient limited by fatigue Patient left: in chair;with call bell/phone within reach;with chair alarm set Nurse Communication: Mobility status (+2 for return to bed)         Time: 1610-9604 PT Time Calculation (min) (ACUTE ONLY): 24 min   Charges:   PT Evaluation $PT Re-evaluation: 1 Procedure PT Treatments $Therapeutic Activity: 8-22 mins   PT G Codes:       Kayelyn Lemon H. Manson Passey, PT, DPT, NCS 01/21/2015, 2:59 PM [phone]

## 2015-01-21 NOTE — Progress Notes (Signed)
[facility]  ROUNDING NOTE   Subjective:  Urine output improved over the past 24 hours. This was found to be 4.2 L. No new creatinine available thus far today.  Objective:  Vital signs in last 24 hours:  Temp:  [98.1 F (36.7 C)-98.9 F (37.2 C)] 98.9 F (37.2 C) (11/29 0409) Pulse Rate:  [71-81] 81 (11/29 0409) Resp:  [18-20] 20 (11/29 0409) BP: (136-179)/(74-82) 179/80 mmHg (11/29 0409) SpO2:  [90 %-97 %] 90 % (11/29 0409) Weight:  [103.692 kg (228 lb 9.6 oz)] 103.692 kg (228 lb 9.6 oz) (11/29 0409)  Weight change: -1.684 kg (-3 lb 11.4 oz) Filed Weights   01/19/15 0625 01/20/15 0500 01/21/15 0409  Weight: 108.5 kg (239 lb 3.2 oz) 105.376 kg (232 lb 5 oz) 103.692 kg (228 lb 9.6 oz)    Intake/Output: I/O last 3 completed shifts: In: 795 [P.O.:720; I.V.:75] Out: 4600 [Urine:4600]   Intake/Output this shift:     Physical Exam: General: NAD, resting in bed  Head: Normocephalic, atraumatic. Moist oral mucosal membranes  Eyes: Anicteric  Neck: Supple, trachea midline  Lungs:  Basilar rales, normal effort  Heart: Regular rate and rhythm  Abdomen:  Soft, nontender, BS present  Extremities: trace peripheral edema.  Neurologic: Nonfocal, moving all four extremities  Skin: No lesions  Access: none    Basic Metabolic Panel:  Recent Labs Lab 01/16/15 2031 01/17/15 0538 01/18/15 0518 01/18/15 1517 01/19/15 0544 01/20/15 0517  NA 140 142 143 140 141 142  K 3.3* 3.5 3.2* 3.8 3.8 3.4*  CL 105 108 109 104 105 107  CO2 GLUCOSE 386* 256* 103* 159* 242* 121*  BUN 52* 53* 68* 68* 72* 81*  CREATININE 4.60* 4.95* 5.36* 4.99* 4.93* 5.25*  CALCIUM 6.4* 6.6* 6.4* 6.6* 6.3* 6.2*  MG 1.4*  --   --   --   --  1.6*  PHOS  --   --  5.9*  --   --   --     Liver Function Tests:  Recent Labs Lab 01/16/15 2031  AST 22  ALT 11*  ALKPHOS 81  BILITOT 0.3  PROT 6.5  ALBUMIN 2.4*   No results for input(s): LIPASE, AMYLASE in the last 168  hours. No results for input(s): AMMONIA in the last 168 hours.  CBC:  Recent Labs Lab 01/16/15 2031 01/17/15 0538 01/19/15 0544 01/20/15 0517  WBC 10.3 7.9 10.9*  --   NEUTROABS 9.0*  --   --   --   HGB 8.2* 7.5* 7.5* 6.5*  HCT 24.6* 22.3* 22.7*  --   MCV 85.1 84.5 84.4  --   PLT 277 244 238  --     Cardiac Enzymes:  Recent Labs Lab 01/16/15 2031 01/18/15 1517  TROPONINI <0.03 0.03    BNP: Invalid input(s): POCBNP  CBG:  Recent Labs Lab 01/20/15 0724 01/20/15 1119 01/20/15 1635 01/20/15 2126 01/21/15 0745  GLUCAP 101* 159* 117* 119* 165*    Microbiology: Results for orders placed or performed during the hospital encounter of 01/16/15  MRSA PCR Screening     Status: None   Collection Time: 01/18/15  3:10 PM  Result Value Ref Range Status   MRSA by PCR NEGATIVE NEGATIVE Final    Comment:        The GeneXpert MRSA Assay (FDA approved for NASAL specimens only), is one component of a comprehensive MRSA colonization surveillance program. It is not intended to diagnose MRSA infection nor  to guide or monitor treatment for MRSA infections.     Coagulation Studies: No results for input(s): LABPROT, INR in the last 72 hours.  Urinalysis: No results for input(s): COLORURINE, LABSPEC, PHURINE, GLUCOSEU, HGBUR, BILIRUBINUR, KETONESUR, PROTEINUR, UROBILINOGEN, NITRITE, LEUKOCYTESUR in the last 72 hours.  Invalid input(s): APPERANCEUR    Imaging: No results found.   Medications:   . furosemide (LASIX) infusion 5 mg/hr (01/20/15 1439)   . amLODipine  10 mg Oral Daily  . antiseptic oral rinse  7 mL Mouth Rinse BID  . aspirin  81 mg Oral Daily  . furosemide  120 mg Intravenous Once  . glipiZIDE  5 mg Oral QAC breakfast  . heparin  5,000 Units Subcutaneous 3 times per day  . hydrALAZINE  10 mg Oral 4 times per day  . insulin aspart  0-9 Units Subcutaneous TID AC & HS  . magnesium oxide  400 mg Oral BID  . metoprolol tartrate  50 mg Oral BID  .  senna-docusate  2 tablet Oral QHS  . sevelamer carbonate  800 mg Oral TID WC     Assessment/ Plan:  74 y.o. 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   1. Acute renal failure/chronic kidney disease stage IV/proteinuria. [facility], [facility]. Was to have outpt renal biopsy but cancelled due to death in the family. -Awaiting new renal function testing today. However it appears that he had excellent urine output of 4.2 L yesterday. Hold Lasix drip for now.  2. Acute diastolic heart failure. Has responded well to diuresis. Hold Lasix drip for now as above.  3. Anemia of chronic kidney disease.  Hemoglobin yesterday was found to be 6.5. Would consider repeat CBC today. Defer to hospitalist.  4. Secondary hyperparathyroidism:  Continue Renvela at this time. Follow phosphorus periodically.  LOS: 5 Kevin Singleton 11/29/20167:52 AM

## 2015-01-21 NOTE — Progress Notes (Signed)
Initial appointment made at the [facility] on February 04, 2015 at 9:00am. Thank you.

## 2015-01-21 NOTE — [place] FL2 (Signed)
[place] MEDICAID FL2 LEVEL OF CARE SCREENING TOOL     IDENTIFICATION  Patient Name: Kevin Singleton Birthdate: August 18, 1940 Sex: male Admission Date (Current Location): 01/16/2015  [facility] [place]   Facility and Address:  [facility], [address], [place], [place] [address]      Provider Number: [id number]  Attending Physician Name and Address:  Alford Highlandichard Wieting, MD  Relative Name and Phone Number:       Current Level of Care: Hospital Recommended Level of Care: Skilled Nursing Facility Prior Approval Number:    Date Approved/Denied:   PASRR Number:   [id number]    Discharge Plan: SNF    Current Diagnoses: Patient Active Problem List   Diagnosis Date Noted  . Fluid overload 01/16/2015    Orientation ACTIVITIES/SOCIAL BLADDER RESPIRATION    Self  Active Continent, Indwelling catheter Normal  BEHAVIORAL SYMPTOMS/MOOD NEUROLOGICAL BOWEL NUTRITION STATUS   (none)  (none) Continent Diet  PHYSICIAN VISITS COMMUNICATION OF NEEDS Height & Weight Skin  30 days Verbally   228 lbs. Normal          AMBULATORY STATUS RESPIRATION    Assist extensive Normal      Personal Care Assistance Level of Assistance  Bathing, Dressing Bathing Assistance: Maximum assistance   Dressing Assistance: Maximum assistance      Functional Limitations Info  Sight Sight Info: Impaired           SPECIAL CARE FACTORS FREQUENCY  PT (By licensed PT)                   Additional Factors Info  Code Status, Allergies Code Status Info: Full Allergies Info: neosporin           Current Medications (01/21/2015):  This is the current hospital active medication list Current Facility-Administered Medications  Medication Dose Route Frequency Provider Last Rate Last Dose  . amLODipine (NORVASC) tablet 10 mg  10 mg Oral Daily Alford Highlandichard Wieting, MD   10 mg at 01/21/15 0844  . antiseptic oral rinse (CPC / CETYLPYRIDINIUM CHLORIDE 0.05%)  solution 7 mL  7 mL Mouth Rinse BID Sital Mody, MD   7 mL at 01/21/15 1000  . aspirin chewable tablet 81 mg  81 mg Oral Daily Altamese DillingVaibhavkumar Vachhani, MD   81 mg at 01/21/15 0844  . furosemide (LASIX) 120 mg in dextrose 5 % 50 mL IVPB  120 mg Intravenous Once Adrian SaranSital Mody, MD   120 mg at 01/18/15 1456  . glipiZIDE (GLUCOTROL) tablet 5 mg  5 mg Oral QAC breakfast Alford Highlandichard Wieting, MD   5 mg at 01/21/15 0845  . heparin injection 5,000 Units  5,000 Units Subcutaneous 3 times per day Altamese DillingVaibhavkumar Vachhani, MD   5,000 Units at 01/21/15 1438  . hydrALAZINE (APRESOLINE) injection 10 mg  10 mg Intravenous Q6H PRN Adrian SaranSital Mody, MD      . hydrALAZINE (APRESOLINE) tablet 25 mg  25 mg Oral 4 times per day Alford Highlandichard Wieting, MD   25 mg at 01/21/15 1154  . insulin aspart (novoLOG) injection 0-9 Units  0-9 Units Subcutaneous TID AC & HS Oralia Manisavid Willis, MD   1 Units at 01/21/15 1154  . ipratropium-albuterol (DUONEB) 0.5-2.5 (3) MG/3ML nebulizer solution 3 mL  3 mL Nebulization Q6H PRN Vishal Mungal, MD   3 mL at 01/20/15 0747  . magnesium oxide (MAG-OX) tablet 400 mg  400 mg Oral BID Alford Highlandichard Wieting, MD   400 mg at 01/21/15 0845  . metoprolol (LOPRESSOR) tablet 50  mg  50 mg Oral BID Adrian Saran, MD   50 mg at 01/21/15 0845  . nitroGLYCERIN (NITROSTAT) SL tablet 0.4 mg  0.4 mg Sublingual Q5 min PRN Adrian Saran, MD      . senna-docusate (Senokot-S) tablet 2 tablet  2 tablet Oral QHS Alford Highland, MD   2 tablet at 01/20/15 2115  . sevelamer carbonate (RENVELA) tablet 800 mg  800 mg Oral TID WC Munsoor Lateef, MD   800 mg at 01/21/15 1154     Discharge Medications: Please see discharge summary for a list of discharge medications.  Relevant Imaging Results:  Relevant Lab Results:  Recent Labs    Additional Information SS: [id number]  [place] Spaniel, LCSW

## 2015-01-22 ENCOUNTER — Inpatient Hospital Stay: Payer: Medicare Other

## 2015-01-22 LAB — BASIC METABOLIC PANEL
Anion gap: 9 (ref 5–15)
BUN: 72 mg/dL — AB (ref 6–20)
CHLORIDE: 103 mmol/L (ref 101–111)
CO2: 30 mmol/L (ref 22–32)
CREATININE: 4.23 mg/dL — AB (ref 0.61–1.24)
Calcium: 6.3 mg/dL — CL (ref 8.9–10.3)
GFR calc Af Amer: 14 mL/min — ABNORMAL LOW (ref >60)
GFR calc non Af Amer: 12 mL/min — ABNORMAL LOW (ref >60)
GLUCOSE: 176 mg/dL — AB (ref 65–99)
POTASSIUM: 3.4 mmol/L — AB (ref 3.5–5.1)
Sodium: 142 mmol/L (ref 135–145)

## 2015-01-22 LAB — CBC
HEMATOCRIT: 20.7 % — AB (ref 40.0–52.0)
HEMOGLOBIN: 6.8 g/dL — AB (ref 13.0–18.0)
MCH: 27.6 pg (ref 26.0–34.0)
MCHC: 33 g/dL (ref 32.0–36.0)
MCV: 83.5 fL (ref 80.0–100.0)
Platelets: 220 10*3/uL (ref 150–440)
RBC: 2.48 MIL/uL — AB (ref 4.40–5.90)
RDW: 15 % — ABNORMAL HIGH (ref 11.5–14.5)
WBC: 14 10*3/uL — ABNORMAL HIGH (ref 3.8–10.6)

## 2015-01-22 LAB — GLUCOSE, CAPILLARY
GLUCOSE-CAPILLARY: 142 mg/dL — AB (ref 65–99)
GLUCOSE-CAPILLARY: 120 mg/dL — AB (ref 65–99)
GLUCOSE-CAPILLARY: 177 mg/dL — AB (ref 65–99)
Glucose-Capillary: 193 mg/dL — ABNORMAL HIGH (ref 65–99)

## 2015-01-22 LAB — MAGNESIUM: MAGNESIUM: 1.5 mg/dL — AB (ref 1.7–2.4)

## 2015-01-22 LAB — PREPARE RBC (CROSSMATCH)

## 2015-01-22 MED ORDER — SODIUM CHLORIDE 0.9 % IV SOLN
Freq: Once | INTRAVENOUS | Status: AC
  Administered 2015-01-22: 12:00:00 via INTRAVENOUS

## 2015-01-22 MED ORDER — MORPHINE SULFATE (PF) 2 MG/ML IV SOLN
2.0000 mg | Freq: Once | INTRAVENOUS | Status: AC
  Administered 2015-01-22: 2 mg via INTRAVENOUS
  Filled 2015-01-22: qty 1

## 2015-01-22 MED ORDER — MAGNESIUM SULFATE 2 GM/50ML IV SOLN
2.0000 g | Freq: Once | INTRAVENOUS | Status: AC
  Administered 2015-01-22: 2 g via INTRAVENOUS
  Filled 2015-01-22: qty 50

## 2015-01-22 MED ORDER — FUROSEMIDE 10 MG/ML IJ SOLN
80.0000 mg | Freq: Once | INTRAMUSCULAR | Status: AC
  Administered 2015-01-22: 80 mg via INTRAVENOUS
  Filled 2015-01-22: qty 8

## 2015-01-22 MED ORDER — SODIUM CHLORIDE 0.9 % IJ SOLN
3.0000 mL | INTRAMUSCULAR | Status: DC | PRN
  Administered 2015-01-22: 3 mL via INTRAVENOUS

## 2015-01-22 MED ORDER — ACETAMINOPHEN 325 MG PO TABS
650.0000 mg | ORAL_TABLET | Freq: Once | ORAL | Status: AC
  Administered 2015-01-22: 650 mg via ORAL
  Filled 2015-01-22: qty 2

## 2015-01-22 MED ORDER — PREDNISONE 20 MG PO TABS
20.0000 mg | ORAL_TABLET | Freq: Every day | ORAL | Status: DC
  Administered 2015-01-22 – 2015-01-24 (×3): 20 mg via ORAL
  Filled 2015-01-22 (×3): qty 1

## 2015-01-22 NOTE — Progress Notes (Signed)
PT Cancellation Note  Patient Details Name: Kevin Singleton Record MRN: [id number] DOB: 10/12/1940   Cancelled Treatment:    Reason Eval/Treat Not Completed:  (Chart reviewed for planned treatment session.  Patient noted with recurrent drop in HgB (6.8 today), pending transfusion 1 unit PRBC. Will hold at this time and re-attempt at later time/date as medically appropriate.)  Tasmine Hipwell H. Manson PasseyBrown, PT, DPT, NCS 01/22/2015, 3:22 PM [phone]

## 2015-01-22 NOTE — Care Management (Signed)
Acute 02 down to 2 liters.Marland Kitchen.  lasix drip discontinued. No acute need for hemodialysis per nephrology.  Transfused with one unit for hgb of 6.8.  Physical therapy is recommending skilled nursing placement due to his profound weakness and debilitation from his illness.  CSW involved

## 2015-01-22 NOTE — Care Management Important Message (Signed)
Important Message  Patient Details  Name: Kevin Singleton MRN: [id number] Date of Birth: 24-Oct-1940   Medicare Important Message Given:  Yes    Olegario MessierKathy A Anysia Choi 01/22/2015, 9:52 AM

## 2015-01-22 NOTE — Progress Notes (Signed)
[facility]  ROUNDING NOTE   Subjective:  Weight now down to 101 kg. Shortness of breath significant improvement. Urine output 4.2 L over the past 3 shifts. Lasix was held.  Objective:  Vital signs in last 24 hours:  Temp:  [98.7 F (37.1 C)-100.5 F (38.1 C)] 98.7 F (37.1 C) (11/30 0820) Pulse Rate:  [81-93] 90 (11/30 0820) Resp:  [18-20] 18 (11/30 0820) BP: (133-157)/(67-90) 155/67 mmHg (11/30 0820) SpO2:  [92 %-100 %] 94 % (11/30 0820) Weight:  [101.833 kg (224 lb 8 oz)] 101.833 kg (224 lb 8 oz) (11/30 0420)  Weight change: -1.86 kg (-4 lb 1.6 oz) Filed Weights   01/20/15 0500 01/21/15 0409 01/22/15 0420  Weight: 105.376 kg (232 lb 5 oz) 103.692 kg (228 lb 9.6 oz) 101.833 kg (224 lb 8 oz)    Intake/Output: I/O last 3 completed shifts: In: 480 [P.O.:480] Out: 4200 [Urine:4200]   Intake/Output this shift:     Physical Exam: General: NAD, resting in bed  Head: Normocephalic, atraumatic. Moist oral mucosal membranes  Eyes: Anicteric  Neck: Supple, trachea midline  Lungs:  CTAB, normal effort  Heart: Regular rate and rhythm  Abdomen:  Soft, nontender, BS present  Extremities: trace peripheral edema.  Neurologic: Nonfocal, moving all four extremities  Skin: No lesions  Access: none    Basic Metabolic Panel:  Recent Labs Lab 01/16/15 2031  01/18/15 0518 01/18/15 1517 01/19/15 0544 01/20/15 0517 01/21/15 0705 01/22/15 0416  NA 140  < > 143 140 141 142 142 142  K 3.3*  < > 3.2* 3.8 3.8 3.4* 3.4* 3.4*  CL 105  < > 109 104 105 107 104 103  CO2 25  < > 26 24 25 28 27 30   GLUCOSE 386*  < > 103* 159* 242* 121* 160* 176*  BUN 52*  < > 68* 68* 72* 81* 78* 72*  CREATININE 4.60*  < > 5.36* 4.99* 4.93* 5.25* 4.60* 4.23*  CALCIUM 6.4*  < > 6.4* 6.6* 6.3* 6.2* 6.3* 6.3*  MG 1.4*  --   --   --   --  1.6*  --  1.5*  PHOS  --   --  5.9*  --   --   --  5.5*  --   < > = values in this interval not displayed.  Liver Function Tests:  Recent Labs Lab  01/16/15 2031 01/21/15 0705  AST 22  --   ALT 11*  --   ALKPHOS 81  --   BILITOT 0.3  --   PROT 6.5  --   ALBUMIN 2.4* 2.2*   No results for input(s): LIPASE, AMYLASE in the last 168 hours. No results for input(s): AMMONIA in the last 168 hours.  CBC:  Recent Labs Lab 01/16/15 2031 01/17/15 0538 01/19/15 0544 01/20/15 0517 01/21/15 0802 01/22/15 0416  WBC 10.3 7.9 10.9*  --   --  14.0*  NEUTROABS 9.0*  --   --   --   --   --   HGB 8.2* 7.5* 7.5* 6.5* 7.7* 6.8*  HCT 24.6* 22.3* 22.7*  --   --  20.7*  MCV 85.1 84.5 84.4  --   --  83.5  PLT 277 244 238  --   --  220    Cardiac Enzymes:  Recent Labs Lab 01/16/15 2031 01/18/15 1517  TROPONINI <0.03 0.03    BNP: Invalid input(s): POCBNP  CBG:  Recent Labs Lab 01/21/15 0745 01/21/15 1143 01/21/15 1625 01/21/15 2134 01/22/15  0720  GLUCAP 165* 140* 138* 163* 142*    Microbiology: Results for orders placed or performed during the hospital encounter of 01/16/15  MRSA PCR Screening     Status: None   Collection Time: 01/18/15  3:10 PM  Result Value Ref Range Status   MRSA by PCR NEGATIVE NEGATIVE Final    Comment:        The GeneXpert MRSA Assay (FDA approved for NASAL specimens only), is one component of a comprehensive MRSA colonization surveillance program. It is not intended to diagnose MRSA infection nor to guide or monitor treatment for MRSA infections.     Coagulation Studies: No results for input(s): LABPROT, INR in the last 72 hours.  Urinalysis: No results for input(s): COLORURINE, LABSPEC, PHURINE, GLUCOSEU, HGBUR, BILIRUBINUR, KETONESUR, PROTEINUR, UROBILINOGEN, NITRITE, LEUKOCYTESUR in the last 72 hours.  Invalid input(s): APPERANCEUR    Imaging: No results found.   Medications:     . amLODipine  10 mg Oral Daily  . antiseptic oral rinse  7 mL Mouth Rinse BID  . aspirin  81 mg Oral Daily  . furosemide  120 mg Intravenous Once  . glipiZIDE  5 mg Oral QAC breakfast  .  heparin  5,000 Units Subcutaneous 3 times per day  . hydrALAZINE  25 mg Oral 4 times per day  . insulin aspart  0-9 Units Subcutaneous TID AC & HS  . magnesium oxide  400 mg Oral BID  . metoprolol tartrate  50 mg Oral BID  . predniSONE  20 mg Oral Q breakfast  . senna-docusate  2 tablet Oral QHS  . sevelamer carbonate  800 mg Oral TID WC     Assessment/ Plan:  74 y.o. 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   1. Acute renal failure/chronic kidney disease stage IV/proteinuria. Baseline creatinine 3.4 from 11/28/2014 at [facility]. History of abnormal ANA, kappa/lambda light chain ratio at [facility]. Was to have outpt renal biopsy but cancelled due to death in the family. -Renal function slightly improved today as compared to yesterday however still above his baseline. However patient has excellent urine output therefore no acute indication for dialysis at this time. Hold Lasix for 1 additional day. Okay to start Lasix tomorrow at 80 mg by mouth twice a day.   2. Acute diastolic heart failure. weight has gone from 109 kg down to 101 kg. This is approximately 16 pounds weight loss. We are holding Lasix for now and recommend restarting Lasix tomorrow with 80 mg by mouth twice a day.   3. Anemia of chronic kidney disease. hemoglobin currently 6.8. Consider additional transfusion.  4. Secondary hyperparathyroidism:  Continue Renvela at this time.   LOS: 6 Nezzie Manera 11/30/201610:29 AM

## 2015-01-22 NOTE — Progress Notes (Signed)
Clinical Child psychotherapistocial Worker (CSW) consult for SNF placement, PT is recommending STR to assist with getting patient back to previous level of functioning.   Patient was not alert  (Information provided by daughter Misty StanleyLisa at bedside).  CSW introduced self and explained role of CSW department.    Patient currently lives alone in [place] . CSW explained that PT is recommending rehab.  Patient has not  been to SNF in the past. CSW reviewed SNF process with patient, Patient's daughter is agreeable to SNF search and prefers facility near [place] .     CSW will fax out for available bed in anticipation of discharging to SNF for rehab.  Kevin Singleton. LCSWA, MSW Clinical Social Work Department 1:10 PM

## 2015-01-22 NOTE — Progress Notes (Signed)
Dr Anne HahnWillis on phone with another staff member,  Notified of chest pain episode with no relief with nitroglycerin tabs sublingual X 3.  VSS.  P continues to rate midsternal chest pain 5/10.  EKG to be done as ordered.

## 2015-01-22 NOTE — Progress Notes (Signed)
Patient ID: Kevin Singleton, male   DOB: 12-29-1940, 74 y.o.   MRN: [id number] [facility] PROGRESS NOTE  PCP: Donell Sievert, MD  HPI/Subjective: Patient complains of left ankle pain. He has a history of gout. He states his breathing is better. Still with some cough.  Objective: Filed Vitals:   01/22/15 1236 01/22/15 1254  BP: 146/72 131/70  Pulse: 77 77  Temp: 99.8 F (37.7 C) 100 F (37.8 C)  Resp: 17 16    Filed Weights   01/20/15 0500 01/21/15 0409 01/22/15 0420  Weight: 105.376 kg (232 lb 5 oz) 103.692 kg (228 lb 9.6 oz) 101.833 kg (224 lb 8 oz)    ROS: Review of Systems  Constitutional: Negative for fever and chills.  Eyes: Negative for blurred vision.  Respiratory: Positive for cough and shortness of breath. Negative for wheezing.   Cardiovascular: Negative for chest pain.  Gastrointestinal: Positive for constipation. Negative for nausea, vomiting, abdominal pain and diarrhea.  Genitourinary: Negative for dysuria.  Musculoskeletal: Positive for joint pain.  Neurological: Negative for dizziness and headaches.   Exam: Physical Exam  HENT:  Nose: No mucosal edema.  Mouth/Throat: No oropharyngeal exudate or posterior oropharyngeal edema.  Eyes: Conjunctivae, EOM and lids are normal. Pupils are equal, round, and reactive to light.  Neck: No JVD present. Carotid bruit is not present. No thyroid mass and no thyromegaly present.  Cardiovascular: S1 normal and S2 normal.  Exam reveals no gallop.   Murmur heard.  Systolic murmur is present with a grade of 2/6  Pulses:      Dorsalis pedis pulses are 2+ on the right side, and 2+ on the left side.  Respiratory: No respiratory distress. He has no decreased breath sounds. He has no wheezes. He has no rhonchi. He has rales in the right lower field and the left lower field.  GI: Soft. Bowel sounds are normal. He exhibits distension. There is no tenderness.  Musculoskeletal:       Right ankle: He exhibits swelling.   Left ankle: He exhibits decreased range of motion and swelling.  Lymphadenopathy:    He has no cervical adenopathy.  Neurological: He is alert. No cranial nerve deficit.  Skin: Skin is warm. No rash noted. Nails show no clubbing.  Psychiatric: He has a normal mood and affect.    Data Reviewed: Basic Metabolic Panel:  Recent Labs Lab 01/16/15 2031  01/18/15 0518 01/18/15 1517 01/19/15 0544 01/20/15 0517 01/21/15 0705 01/22/15 0416  NA 140  < > 143 140 141 142 142 142  K 3.3*  < > 3.2* 3.8 3.8 3.4* 3.4* 3.4*  CL 105  < > 109 104 105 107 104 103  CO2 25  < > GLUCOSE 386*  < > 103* 159* 242* 121* 160* 176*  BUN 52*  < > 68* 68* 72* 81* 78* 72*  CREATININE 4.60*  < > 5.36* 4.99* 4.93* 5.25* 4.60* 4.23*  CALCIUM 6.4*  < > 6.4* 6.6* 6.3* 6.2* 6.3* 6.3*  MG 1.4*  --   --   --   --  1.6*  --  1.5*  PHOS  --   --  5.9*  --   --   --  5.5*  --   < > = values in this interval not displayed. Liver Function Tests:  Recent Labs Lab 01/16/15 2031 01/21/15 0705  AST 22  --   ALT 11*  --   ALKPHOS 81  --  BILITOT 0.3  --   PROT 6.5  --   ALBUMIN 2.4* 2.2*   CBC:  Recent Labs Lab 01/16/15 2031 01/17/15 0538 01/19/15 0544 01/20/15 0517 01/21/15 0802 01/22/15 0416  WBC 10.3 7.9 10.9*  --   --  14.0*  NEUTROABS 9.0*  --   --   --   --   --   HGB 8.2* 7.5* 7.5* 6.5* 7.7* 6.8*  HCT 24.6* 22.3* 22.7*  --   --  20.7*  MCV 85.1 84.5 84.4  --   --  83.5  PLT 277 244 238  --   --  220   Cardiac Enzymes:  Recent Labs Lab 01/16/15 2031 01/18/15 1517  TROPONINI <0.03 0.03   BNP (last 3 results)  Recent Labs  01/16/15 2031  BNP 522.0*    Scheduled Meds: . amLODipine  10 mg Oral Daily  . antiseptic oral rinse  7 mL Mouth Rinse BID  . aspirin  81 mg Oral Daily  . glipiZIDE  5 mg Oral QAC breakfast  . heparin  5,000 Units Subcutaneous 3 times per day  . hydrALAZINE  25 mg Oral 4 times per day  . insulin aspart  0-9 Units Subcutaneous TID AC & HS  .  magnesium oxide  400 mg Oral BID  . metoprolol tartrate  50 mg Oral BID  . predniSONE  20 mg Oral Q breakfast  . senna-docusate  2 tablet Oral QHS  . sevelamer carbonate  800 mg Oral TID WC    Assessment/Plan:  1. Acute respiratory failure with hypoxia - down to 2 L.  2. Acute diastolic congestive heart failure and fluid overload. Recent echocardiogram showed an EF of 55-60%. Patient is on low-dose metoprolol, Imdur and hydralazine. Nephrology suspended the Lasix drip yesterday. We'll give IV Lasix prior to blood transfusion. 3. Acute renal failure on chronic kidney disease stage IV. Current creatinine is 4.33 with a GFR of 14. Continue to monitor creatinine daily. 4. Anemia of chronic disease- today's hemoglobin 6.8. I will transfuse 1 unit of packed red blood cells. Consent over the phone with daughter. Benefits and risks of blood transfusion explained. 5. Wheeze- improved. Likely all cardiac wheeze. 6. Type 2 diabetes mellitus- sliding scale and low-dose glipizide. 7. Hypokalemia, hypomagnesemia- replace magnesium IV today and orally. Continue to watch potassium levels. 8. Acute gout left ankle- prednisone daily 9. Weakness- likely will need rehabilitation upon discharge.  Code Status:     Code Status Orders        Start     Ordered   01/16/15 2304  Full code   Continuous     01/16/15 2303     Disposition Plan: To be determined, case discussed with daughter on phone today  Consultants:  Nephrology  Time spent: 25 minutes  Alford HighlandWIETING, Antwian Santaana  [facility]

## 2015-01-22 NOTE — Clinical Social Work Placement (Addendum)
   CLINICAL SOCIAL WORK PLACEMENT  NOTE  Date:  01/22/2015  Patient Details  Name: Kevin Singleton MRN: [id number] Date of Birth: 1940/12/04  Clinical Social Work is seeking post-discharge placement for this patient at the Skilled  Nursing Facility level of care (*CSW will initial, date and re-position this form in  chart as items are completed):  Yes   Patient/family provided with [facility] Clinical Social Work Department's list of facilities offering this level of care within the geographic area requested by the patient (or if unable, by the patient's family).  Yes   Patient/family informed of their freedom to choose among providers that offer the needed level of care, that participate in Medicare, Medicaid or managed care program needed by the patient, have an available bed and are willing to accept the patient.  Yes   Patient/family informed of [facility]'s ownership interest in [facility] and [facility], as well as of the fact that they are under no obligation to receive care at these facilities.  PASRR submitted to EDS on       PASRR number received on       Existing PASRR number confirmed on 01/22/15     FL2 transmitted to all facilities in geographic area requested by pt/family on 01/22/15     FL2 transmitted to all facilities within larger geographic area on       Patient informed that his/her managed care company has contracts with or will negotiate with certain facilities, including the following:         01/23/2015   Patient/family informed of bed offers received.  Patient chooses bed at   [facility] [place]    Physician recommends and patient chooses bed at      Patient to be transferred to   [facility] on  .01/24/2015  Patient to be transferred to facility by  EMS     Patient family notified on  01/24/15 of transfer.  Name of family member notified:   daughter Liborio NixonJanice      PHYSICIAN       Additional  Comment:    _______________________________________________ Soundra PilonMoore, Alixander Rallis H, LCSW 01/22/2015, 1:11 PM

## 2015-01-23 LAB — BASIC METABOLIC PANEL
Anion gap: 9 (ref 5–15)
BUN: 74 mg/dL — ABNORMAL HIGH (ref 6–20)
CHLORIDE: 104 mmol/L (ref 101–111)
CO2: 31 mmol/L (ref 22–32)
Calcium: 6.8 mg/dL — ABNORMAL LOW (ref 8.9–10.3)
Creatinine, Ser: 4.36 mg/dL — ABNORMAL HIGH (ref 0.61–1.24)
GFR calc non Af Amer: 12 mL/min — ABNORMAL LOW (ref >60)
GFR, EST AFRICAN AMERICAN: 14 mL/min — AB (ref >60)
Glucose, Bld: 150 mg/dL — ABNORMAL HIGH (ref 65–99)
POTASSIUM: 3.6 mmol/L (ref 3.5–5.1)
SODIUM: 144 mmol/L (ref 135–145)

## 2015-01-23 LAB — TYPE AND SCREEN
ABO/RH(D): O POS
ANTIBODY SCREEN: NEGATIVE
Unit division: 0

## 2015-01-23 LAB — GLUCOSE, CAPILLARY
GLUCOSE-CAPILLARY: 222 mg/dL — AB (ref 65–99)
GLUCOSE-CAPILLARY: 200 mg/dL — AB (ref 65–99)
Glucose-Capillary: 200 mg/dL — ABNORMAL HIGH (ref 65–99)

## 2015-01-23 LAB — HEMOGLOBIN: HEMOGLOBIN: 7.5 g/dL — AB (ref 13.0–18.0)

## 2015-01-23 LAB — MRSA PCR SCREENING: MRSA by PCR: NEGATIVE

## 2015-01-23 LAB — PHOSPHORUS: PHOSPHORUS: 6.1 mg/dL — AB (ref 2.5–4.6)

## 2015-01-23 MED ORDER — FUROSEMIDE 40 MG PO TABS
80.0000 mg | ORAL_TABLET | Freq: Two times a day (BID) | ORAL | Status: DC
  Administered 2015-01-23 – 2015-01-24 (×2): 80 mg via ORAL
  Filled 2015-01-23 (×2): qty 2

## 2015-01-23 NOTE — Progress Notes (Signed)
Clinical Social Worker (CSW) received call from patient's daughter Thayer Headings [phone] inquiring about SNF search and bed offers. Per Thayer Headings they want patient in a facility in [place] near [place]. CSW made daughter aware that [place] is in [facility] and we can send a referral there today. Daughter reported that if no beds can be found in [facility] they are agreeable to look for SNF in [place]. Daughter also inquired about Medicare SNF coverage. CSW explained that patient will require a 3 night inpatient stay for Medicare to cover SNF. Patient has met the inpatient requirement, he was admitted to inpatient status on 01/16/15. CSW also explained that Medicare will pay for days 1-20 at 100% and days 21-100 and 80%. Daughter thanked CSW for explaining SNF process. CSW will follow up with daughters once bed offers are available. CSW will continue to follow and assist as needed.   Blima Rich, [place] [phone]

## 2015-01-23 NOTE — Progress Notes (Signed)
[facility]  ROUNDING NOTE   Subjective:  The patient's weight is now down to 100 kg. He has had a good diuretic response. Renal function however overall remains quite low with a creatinine of 4.3 and BUN of 74. His current EGFR is 14.  Objective:  Vital signs in last 24 hours:  Temp:  [98.4 F (36.9 C)-100.9 F (38.3 C)] 98.4 F (36.9 C) (12/01 0555) Pulse Rate:  [72-85] 80 (12/01 0736) Resp:  [16-22] 20 (12/01 0736) BP: (131-162)/(70-108) 148/85 mmHg (12/01 0736) SpO2:  [77 %-99 %] 95 % (12/01 1028) Weight:  [100.064 kg (220 lb 9.6 oz)] 100.064 kg (220 lb 9.6 oz) (12/01 0555)  Weight change: -1.769 kg (-3 lb 14.4 oz) Filed Weights   01/21/15 0409 01/22/15 0420 01/23/15 0555  Weight: 103.692 kg (228 lb 9.6 oz) 101.833 kg (224 lb 8 oz) 100.064 kg (220 lb 9.6 oz)    Intake/Output: I/O last 3 completed shifts: In: 500 [P.O.:120; Blood:330; IV Piggyback:50] Out: 9622 [Urine:3550]   Intake/Output this shift:     Physical Exam: General: NAD, resting in bed  Head: Normocephalic, atraumatic. Moist oral mucosal membranes  Eyes: Anicteric  Neck: Supple, trachea midline  Lungs:  CTAB, normal effort  Heart: Regular rate and rhythm  Abdomen:  Soft, nontender, BS present  Extremities: trace peripheral edema.  Neurologic: Nonfocal, moving all four extremities  Skin: No lesions  Access: none    Basic Metabolic Panel:  Recent Labs Lab 01/16/15 2031  01/18/15 0518  01/19/15 0544 01/20/15 0517 01/21/15 0705 01/22/15 0416 01/23/15 0446  NA 140  < > 143  < > 141 142 142 142 144  K 3.3*  < > 3.2*  < > 3.8 3.4* 3.4* 3.4* 3.6  CL 105  < > 109  < > 105 107 104 103 104  CO2 25  < > 26  < > _0 GLUCOSE 386*  < > 103*  < > 242* 121* 160* 176* 150*  BUN 52*  < > 68*  < > 72* 81* 78* 72* 74*  CREATININE 4.60*  < > 5.36*  < > 4.93* 5.25* 4.60* 4.23* 4.36*  CALCIUM 6.4*  < > 6.4*  < > 6.3* 6.2* 6.3* 6.3* 6.8*  MG 1.4*  --   --   --   --  1.6*  --  1.5*   --   PHOS  --   --  5.9*  --   --   --  5.5*  --   --   < > = values in this interval not displayed.  Liver Function Tests:  Recent Labs Lab 01/16/15 2031 01/21/15 0705  AST 22  --   ALT 11*  --   ALKPHOS 81  --   BILITOT 0.3  --   PROT 6.5  --   ALBUMIN 2.4* 2.2*   No results for input(s): LIPASE, AMYLASE in the last 168 hours. No results for input(s): AMMONIA in the last 168 hours.  CBC:  Recent Labs Lab 01/16/15 2031 01/17/15 0538 01/19/15 0544 01/20/15 0517 01/21/15 0802 01/22/15 0416 01/23/15 0446  WBC 10.3 7.9 10.9*  --   --  14.0*  --   NEUTROABS 9.0*  --   --   --   --   --   --   HGB 8.2* 7.5* 7.5* 6.5* 7.7* 6.8* 7.5*  HCT 24.6* 22.3* 22.7*  --   --  20.7*  --   MCV 85.1  84.5 84.4  --   --  83.5  --   PLT 277 244 238  --   --  220  --     Cardiac Enzymes:  Recent Labs Lab 01/16/15 2031 01/18/15 1517  TROPONINI <0.03 0.03    BNP: Invalid input(s): POCBNP  CBG:  Recent Labs Lab 01/21/15 2134 01/22/15 0720 01/22/15 1208 01/22/15 1625 01/22/15 2114  GLUCAP 163* 142* 120* 177* 32*    Microbiology: Results for orders placed or performed during the hospital encounter of 01/16/15  MRSA PCR Screening     Status: None   Collection Time: 01/18/15  3:10 PM  Result Value Ref Range Status   MRSA by PCR NEGATIVE NEGATIVE Final    Comment:        The GeneXpert MRSA Assay (FDA approved for NASAL specimens only), is one component of a comprehensive MRSA colonization surveillance program. It is not intended to diagnose MRSA infection nor to guide or monitor treatment for MRSA infections.     Coagulation Studies: No results for input(s): LABPROT, INR in the last 72 hours.  Urinalysis: No results for input(s): COLORURINE, LABSPEC, PHURINE, GLUCOSEU, HGBUR, BILIRUBINUR, KETONESUR, PROTEINUR, UROBILINOGEN, NITRITE, LEUKOCYTESUR in the last 72 hours.  Invalid input(s): APPERANCEUR    Imaging: US Renal  01/22/2015  CLINICAL DATA:  Acute  renal failure EXAM: RENAL / URINARY TRACT ULTRASOUND COMPLETE COMPARISON:  None. FINDINGS: Right Kidney: Length: 9.6 cm. Moderately increased echogenicity. No hydronephrosis or mass. Left Kidney: Length: 10.3 cm. Moderately increased echogenicity. No hydronephrosis or mass. Bladder: Decompressed by Foley catheter and therefore not evaluated. Incidentally noted are bilateral pleural effusions. IMPRESSION: 1. Medical renal disease 2. Bilateral pleural effusions Electronically Signed   By: Skipper Cliche M.D.   On: 01/22/2015 11:45     Medications:     . amLODipine  10 mg Oral Daily  . antiseptic oral rinse  7 mL Mouth Rinse BID  . aspirin  81 mg Oral Daily  . glipiZIDE  5 mg Oral QAC breakfast  . heparin  5,000 Units Subcutaneous 3 times per day  . hydrALAZINE  25 mg Oral 4 times per day  . insulin aspart  0-9 Units Subcutaneous TID AC & HS  . magnesium oxide  400 mg Oral BID  . metoprolol tartrate  50 mg Oral BID  . predniSONE  20 mg Oral Q breakfast  . senna-docusate  2 tablet Oral QHS  . sevelamer carbonate  800 mg Oral TID WC     Assessment/ Plan:  74 y.o. 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   1. Acute renal failure/chronic kidney disease stage IV/proteinuria. Baseline creatinine 3.4 from 11/28/2014 at [facility]. History of abnormal ANA, kappa/lambda light chain ratio at [facility]. Was to have outpt renal biopsy but cancelled due to death in the family. -Renal function overall remains quite low.  Currently EGFR is 14. No urgent indication to proceed with dialysis as the patient had good diuretic response. We will continue to monitor renal function for now. Advised him to discuss potential for renal biopsy as an  outpatient with his outpatient nephrologist from [facility] nephrology.   2. Acute diastolic heart failure. The patient has had excellent diuretic response. His admission weight was 109 kg and he is down to  100 kg. We will start the patient on Lasix 80 mg by mouth twice a day.  3. Anemia of chronic kidney disease. Patient will likely need to be considered for Epogen as an outpatient.  4. Secondary hyperparathyroidism: Follow-up phosphorus today. Continue Renvela at this time.   LOS: 7 Drury Ardizzone 12/1/201611:09 AM

## 2015-01-23 NOTE — Progress Notes (Signed)
Patient ID: Kevin Singleton, male   DOB: [birth year], 74 y.o.   MRN: [id number] [facility] PROGRESS NOTE  PCP: Donell SievertMILLER,AARON, MD  HPI/Subjective: Patient still having some pain in his left ankle. The pain is less than yesterday. His breathing is okay. Still with some cough and shortness of breath.  Objective: Filed Vitals:   01/23/15 0736 01/23/15 1113  BP: 148/85 142/75  Pulse: 80 85  Temp:  98.7 F (37.1 C)  Resp: 20 22    Filed Weights   01/21/15 0409 01/22/15 0420 01/23/15 0555  Weight: 103.692 kg (228 lb 9.6 oz) 101.833 kg (224 lb 8 oz) 100.064 kg (220 lb 9.6 oz)    ROS: Review of Systems  Constitutional: Negative for fever and chills.  Eyes: Negative for blurred vision.  Respiratory: Positive for cough and shortness of breath. Negative for wheezing.   Cardiovascular: Negative for chest pain.  Gastrointestinal: Negative for nausea, vomiting, abdominal pain, diarrhea and constipation.  Genitourinary: Negative for dysuria.  Musculoskeletal: Positive for joint pain.  Neurological: Negative for dizziness and headaches.   Exam: Physical Exam  HENT:  Nose: No mucosal edema.  Mouth/Throat: No oropharyngeal exudate or posterior oropharyngeal edema.  Eyes: Conjunctivae, EOM and lids are normal. Pupils are equal, round, and reactive to light.  Neck: No JVD present. Carotid bruit is not present. No thyroid mass and no thyromegaly present.  Cardiovascular: S1 normal and S2 normal.  Exam reveals no gallop.   Murmur heard.  Systolic murmur is present with a grade of 2/6  Pulses:      Dorsalis pedis pulses are 2+ on the right side, and 2+ on the left side.  Respiratory: No respiratory distress. He has no decreased breath sounds. He has no wheezes. He has no rhonchi. He has rales in the right lower field and the left lower field.  GI: Soft. Bowel sounds are normal. He exhibits distension. There is no tenderness.  Musculoskeletal:       Right ankle: He exhibits swelling.       Left ankle: He exhibits decreased range of motion and swelling.  Lymphadenopathy:    He has no cervical adenopathy.  Neurological: He is alert. No cranial nerve deficit.  Skin: Skin is warm. No rash noted. Nails show no clubbing.  Psychiatric: He has a normal mood and affect.    Data Reviewed: Basic Metabolic Panel:  Recent Labs Lab 01/16/15 2031  01/18/15 0518  01/19/15 0544 01/20/15 0517 01/21/15 0705 01/22/15 0416 01/23/15 0446  NA 140  < > 143  < > 141 142 142 142 144  K 3.3*  < > 3.2*  < > 3.8 3.4* 3.4* 3.4* 3.6  CL 105  < > 109  < > 105 107 104 103 104  CO2 25  < > 26  < > 25 28 27 30 31   GLUCOSE 386*  < > 103*  < > 242* 121* 160* 176* 150*  BUN 52*  < > 68*  < > 72* 81* 78* 72* 74*  CREATININE 4.60*  < > 5.36*  < > 4.93* 5.25* 4.60* 4.23* 4.36*  CALCIUM 6.4*  < > 6.4*  < > 6.3* 6.2* 6.3* 6.3* 6.8*  MG 1.4*  --   --   --   --  1.6*  --  1.5*  --   PHOS  --   --  5.9*  --   --   --  5.5*  --  6.1*  < > = values in  this interval not displayed. Liver Function Tests:  Recent Labs Lab 01/16/15 2031 01/21/15 0705  AST 22  --   ALT 11*  --   ALKPHOS 81  --   BILITOT 0.3  --   PROT 6.5  --   ALBUMIN 2.4* 2.2*   CBC:  Recent Labs Lab 01/16/15 2031 01/17/15 0538 01/19/15 0544 01/20/15 0517 01/21/15 0802 01/22/15 0416 01/23/15 0446  WBC 10.3 7.9 10.9*  --   --  14.0*  --   NEUTROABS 9.0*  --   --   --   --   --   --   HGB 8.2* 7.5* 7.5* 6.5* 7.7* 6.8* 7.5*  HCT 24.6* 22.3* 22.7*  --   --  20.7*  --   MCV 85.1 84.5 84.4  --   --  83.5  --   PLT 277 244 238  --   --  220  --    Cardiac Enzymes:  Recent Labs Lab 01/16/15 2031 01/18/15 1517  TROPONINI <0.03 0.03   BNP (last 3 results)  Recent Labs  01/16/15 2031  BNP 522.0*    Scheduled Meds: . amLODipine  10 mg Oral Daily  . antiseptic oral rinse  7 mL Mouth Rinse BID  . aspirin  81 mg Oral Daily  . furosemide  80 mg Oral BID  . glipiZIDE  5 mg Oral QAC breakfast  . heparin  5,000 Units  Subcutaneous 3 times per day  . hydrALAZINE  25 mg Oral 4 times per day  . insulin aspart  0-9 Units Subcutaneous TID AC & HS  . magnesium oxide  400 mg Oral BID  . metoprolol tartrate  50 mg Oral BID  . predniSONE  20 mg Oral Q breakfast  . senna-docusate  2 tablet Oral QHS  . sevelamer carbonate  800 mg Oral TID WC    Assessment/Plan:  1. Acute respiratory failure with hypoxia - down to 2 L. check pulse ox on room air in a.m. 2. Acute diastolic congestive heart failure and fluid overload. Recent echocardiogram showed an EF of 55-60%. Patient is on low-dose metoprolol, Imdur and hydralazine. Nephrology started Lasix 80 mg by mouth twice a day 3. Acute renal failure on chronic kidney disease stage IV. Current creatinine is 4.36 with a GFR of 14. Continue to monitor creatinine daily. 4. Anemia of chronic disease- today's hemoglobin 7.5 after transfusion 5. Wheeze- improved. Likely all cardiac wheeze. 6. Type 2 diabetes mellitus- sliding scale and low-dose glipizide. 7. Hypokalemia, hypomagnesemia- replaced 8. Acute gout left ankle- prednisone daily 9. Weakness- likely will need rehabilitation upon discharge.  Code Status:     Code Status Orders        Start     Ordered   01/16/15 2304  Full code   Continuous     01/16/15 2303     Disposition Plan: To be determined, case discussed with daughter on phone yesterday  Consultants:  Nephrology  Time spent: 20 minutes  Alford Highland  [facility]

## 2015-01-23 NOTE — Progress Notes (Signed)
Nutrition Follow-up  INTERVENTION:   Meals and Snacks: Cater to patient preferences  NUTRITION DIAGNOSIS:   Altered nutrition lab value related to acute illness as evidenced by  (Electrolyte and renal profile).  GOAL:   Patient will meet greater than or equal to 90% of their needs  MONITOR:    (Energy Intake, Electrolyte and renal Profile, Anthropometrics)  REASON FOR ASSESSMENT:    (Diet Order)    ASSESSMENT:   Pt admitted with SOB and fluid overload with CHF and acute renal failure/chronic kidney disease stage IV/proteinuria per Nephrology; no acute indication for dialysis however may need to consider pending. Plan for renal ultrasound per MD note.  Pt with left ankle pain with acute gout flare, respiratory failure down to 2L Vera, ECHO with EF 55-60%  Diet Order:  Diet renal/carb modified with fluid restriction Diet-HS Snack?: Nothing; Room service appropriate?: Yes; Fluid consistency:: Thin; Fluid restriction:: 1200 mL Fluid   Energy Intake: recorded po intake 65% on average  Skin:  Reviewed, no issues  Last BM:    constipation  Electrolyte and Renal Profile:  Recent Labs Lab 01/16/15 2031  01/18/15 0518  01/20/15 0517 01/21/15 0705 01/22/15 0416 01/23/15 0446  BUN 52*  < > 68*  < > 81* 78* 72* 74*  CREATININE 4.60*  < > 5.36*  < > 5.25* 4.60* 4.23* 4.36*  NA 140  < > 143  < > 142 142 142 144  K 3.3*  < > 3.2*  < > 3.4* 3.4* 3.4* 3.6  MG 1.4*  --   --   --  1.6*  --  1.5*  --   PHOS  --   --  5.9*  --   --  5.5*  --  6.1*  < > = values in this interval not displayed. Glucose Profile:  Recent Labs  01/22/15 1625 01/22/15 2114 01/23/15 1114  GLUCAP 177* 193* 200*    Meds: lasix, glucotrol, ss novolog, prednisone, renvela, senna-docusate, magox  Height:   Ht Readings from Last 1 Encounters:  01/18/15 5\' 11"  (1.803 m)    Weight:   Wt Readings from Last 1 Encounters:  01/23/15 220 lb 9.6 oz (100.064 kg)    Filed Weights   01/21/15 0409  01/22/15 0420 01/23/15 0555  Weight: 228 lb 9.6 oz (103.692 kg) 224 lb 8 oz (101.833 kg) 220 lb 9.6 oz (100.064 kg)    BMI:  Body mass index is 30.78 kg/(m^2).  Estimated Nutritional Needs:   Kcal:  BEE: 1527kcals, TEE: (IF 1.1-1.3)(AF 1.2) [id number], using IBW of 78kg  Protein:  78-94g protein (1.0-1.2g/kg)  Fluid:  1950-234140mL of fluid (25-11030mL/kg)  EDUCATION NEEDS:   Education needs no appropriate at this time   MODERATE Care Level  Romelle Starcherate Jalaine Riggenbach MS, RD, LDN [phone] Pager

## 2015-01-23 NOTE — Progress Notes (Signed)
Physical Therapy Treatment Patient Details Name: Kevin Singleton MRN: [id number] DOB: 1940-05-27 Today's Date: 01/23/2015    History of Present Illness Patient is a 74 y/o male that presents with increasing shortness of breath and fluid retention; admitted for acute hospitalization secondary to fluid overload.  Hospital course significant for worsening respiratory status requiring transfer to CCU (now returned to floor); orders for continued PT received.  Patient with spontaneous drop in HgB previous date, status post transfusion 1 unit PRBC (7.5)    PT Comments    Tolerating small increases in gait distance and functional endurance this date; maintains sats >92% on 2L.  Continues to be pleasantly confused with noted deficits in STM (unable to recall therapist from previous session or any prior OOB activity).  Remains very unsteady with frequent L lateral LOB.  Continue to recommend STR upon discharge prior to return home alone.   Follow Up Recommendations  SNF     Equipment Recommendations  Rolling walker with 5" wheels    Recommendations for Other Services       Precautions / Restrictions Precautions Precautions: Fall Restrictions Weight Bearing Restrictions: No    Mobility  Bed Mobility Overal bed mobility: Needs Assistance Bed Mobility: Supine to Sit     Supine to sit: Mod assist     General bed mobility comments: hand-over-hand and max verbal cuing to initiate bed mobility this date  Transfers Overall transfer level: Needs assistance Equipment used: Rolling walker (2 wheeled) Transfers: Sit to/from Stand Sit to Stand: Mod assist;+2 physical assistance         General transfer comment: constant cuing for hand placement, task initiation  Ambulation/Gait Ambulation/Gait assistance: Mod assist;+2 physical assistance;+2 safety/equipment Ambulation Distance (Feet): 40 Feet (x2) Assistive device: Rolling walker (2 wheeled)       General Gait Details: narrowed BOS  with excessive L LE adduct in stance, decreased L ant tibial translation with noted genu recurvatum in mid/terminal stance; high risk for L lateral LOB.  Unable to tolerate distance beyond 52' due to fatigue.   Stairs            Wheelchair Mobility    Modified Rankin (Stroke Patients Only)       Balance Overall balance assessment: Needs assistance Sitting-balance support: No upper extremity supported;Feet supported Sitting balance-Leahy Scale: Good     Standing balance support: Bilateral upper extremity supported Standing balance-Leahy Scale: Poor                      Cognition Arousal/Alertness: Awake/alert Behavior During Therapy: WFL for tasks assessed/performed Overall Cognitive Status: Difficult to assess (pleasantly confused, impaired STM)                      Exercises Other Exercises Other Exercises: Seated LE therex, 1x10, AROM for muscular strength/endurance with functional activities    General Comments        Pertinent Vitals/Pain Pain Assessment: No/denies pain    Home Living                      Prior Function            PT Goals (current goals can now be found in the care plan section) Acute Rehab PT Goals Patient Stated Goal: To return home soon-per previous evaluation PT Goal Formulation: With patient Time For Goal Achievement: 02/04/15 Potential to Achieve Goals: Good Progress towards PT goals: Progressing toward goals    Frequency  Min  2X/week    PT Plan Current plan remains appropriate    Co-evaluation             End of Session Equipment Utilized During Treatment: Gait belt;Oxygen Activity Tolerance: Patient limited by fatigue Patient left: in chair;with call bell/phone within reach;with chair alarm set     Time: [id number] PT Time Calculation (min) (ACUTE ONLY): 23 min  Charges:  $Gait Training: 8-22 mins $Therapeutic Exercise: 8-22 mins                    G Codes:       Deina Lipsey H.  Manson PasseyBrown, PT, DPT, NCS 01/23/2015, 12:16 PM [phone]

## 2015-01-23 NOTE — Progress Notes (Signed)
NSR. Wean to 2 L of oxygen. Up to chair for most of the morning. 2 assist. Pt reports no pain. FS are stable. Foley was d/xc. US renal was neg. A & O. Daughters were updated on care plan. Pt has no further concerns at this time.

## 2015-01-23 NOTE — Clinical Social Work Note (Signed)
CSW sent referral to [facility] [place] as family requested that area. Facility will be able to accept pt tomorrow. CSW will follow up with family. CSW will continue to follow.  Dede QuerySarah Sherrika Weakland, MSW, LCSW Clinical Social Worker  [phone]

## 2015-01-24 LAB — GLUCOSE, CAPILLARY
GLUCOSE-CAPILLARY: 177 mg/dL — AB (ref 65–99)
Glucose-Capillary: 201 mg/dL — ABNORMAL HIGH (ref 65–99)

## 2015-01-24 LAB — CBC
HCT: 24.1 % — ABNORMAL LOW (ref 40.0–52.0)
HEMOGLOBIN: 8 g/dL — AB (ref 13.0–18.0)
MCH: 27.7 pg (ref 26.0–34.0)
MCHC: 33.2 g/dL (ref 32.0–36.0)
MCV: 83.5 fL (ref 80.0–100.0)
Platelets: 249 10*3/uL (ref 150–440)
RBC: 2.89 MIL/uL — AB (ref 4.40–5.90)
RDW: 15.1 % — ABNORMAL HIGH (ref 11.5–14.5)
WBC: 12.7 10*3/uL — ABNORMAL HIGH (ref 3.8–10.6)

## 2015-01-24 LAB — BASIC METABOLIC PANEL
Anion gap: 10 (ref 5–15)
BUN: 83 mg/dL — AB (ref 6–20)
CHLORIDE: 102 mmol/L (ref 101–111)
CO2: 29 mmol/L (ref 22–32)
Calcium: 7.1 mg/dL — ABNORMAL LOW (ref 8.9–10.3)
Creatinine, Ser: 4.46 mg/dL — ABNORMAL HIGH (ref 0.61–1.24)
GFR calc Af Amer: 13 mL/min — ABNORMAL LOW (ref >60)
GFR calc non Af Amer: 12 mL/min — ABNORMAL LOW (ref >60)
GLUCOSE: 181 mg/dL — AB (ref 65–99)
POTASSIUM: 3.7 mmol/L (ref 3.5–5.1)
SODIUM: 141 mmol/L (ref 135–145)

## 2015-01-24 MED ORDER — METOPROLOL TARTRATE 50 MG PO TABS: 50.0000 mg | ORAL_TABLET | Freq: Two times a day (BID) | ORAL | Status: DC

## 2015-01-24 MED ORDER — IPRATROPIUM-ALBUTEROL 0.5-2.5 (3) MG/3ML IN SOLN: 3.0000 mL | Freq: Four times a day (QID) | RESPIRATORY_TRACT | Status: DC | PRN

## 2015-01-24 MED ORDER — PREDNISONE 5 MG PO TABS: 5.0000 mg | ORAL_TABLET | Freq: Every day | ORAL | Status: DC

## 2015-01-24 MED ORDER — LACTULOSE 10 GM/15ML PO SOLN
30.0000 g | Freq: Once | ORAL | Status: AC
  Administered 2015-01-24: 30 g via ORAL
  Filled 2015-01-24: qty 60

## 2015-01-24 MED ORDER — INSULIN ASPART 100 UNIT/ML ~~LOC~~ SOLN: 4.0000 [IU] | Freq: Three times a day (TID) | SUBCUTANEOUS | Status: DC

## 2015-01-24 MED ORDER — DOCUSATE SODIUM 250 MG PO CAPS: 250.0000 mg | ORAL_CAPSULE | Freq: Two times a day (BID) | ORAL | Status: DC | PRN

## 2015-01-24 MED ORDER — PREDNISONE 5 MG PO TABS: ORAL_TABLET | ORAL | Status: DC

## 2015-01-24 MED ORDER — FLEET ENEMA 7-19 GM/118ML RE ENEM
1.0000 | ENEMA | RECTAL | Status: AC
  Administered 2015-01-24: 1 via RECTAL

## 2015-01-24 MED ORDER — SEVELAMER CARBONATE 800 MG PO TABS: 800.0000 mg | ORAL_TABLET | Freq: Three times a day (TID) | ORAL | Status: AC

## 2015-01-24 MED ORDER — FUROSEMIDE 80 MG PO TABS: 80.0000 mg | ORAL_TABLET | Freq: Two times a day (BID) | ORAL | Status: DC

## 2015-01-24 MED ORDER — ALLOPURINOL 100 MG PO TABS: 50.0000 mg | ORAL_TABLET | Freq: Every day | ORAL | Status: AC

## 2015-01-24 MED ORDER — HYDRALAZINE HCL 25 MG PO TABS: 25.0000 mg | ORAL_TABLET | Freq: Four times a day (QID) | ORAL | Status: AC

## 2015-01-24 MED ORDER — MAGNESIUM OXIDE 400 (241.3 MG) MG PO TABS: 400.0000 mg | ORAL_TABLET | Freq: Every day | ORAL | Status: AC

## 2015-01-24 MED ORDER — AMLODIPINE BESYLATE 10 MG PO TABS: 10.0000 mg | ORAL_TABLET | Freq: Every day | ORAL | Status: AC

## 2015-01-24 MED ORDER — GLIPIZIDE 5 MG PO TABS: 5.0000 mg | ORAL_TABLET | Freq: Every day | ORAL | Status: DC

## 2015-01-24 NOTE — Progress Notes (Signed)
Cardiac Monitoring Event  Dysrhythmia:   5 Beat run VT noted on telemetry  Symptoms:  Pt sleeping.  No voiced complaints  Level of Consciousness:  Arouses easily, denies pain or SOB on arousal, skin warm dry  Last set of vital signs taken:  Temp: 98.3 F (36.8 C)  Pulse Rate: 67  Resp: 18  BP: 135/73 mmHg  SpO2: 94 %  Name of MD Notified:  Dr Sheryle Hailiamond  Time MD Notified:  0100  Comments/Actions Taken:  Continue to monitor

## 2015-01-24 NOTE — Care Management (Signed)
For discharge today to skilled nursing 

## 2015-01-24 NOTE — Progress Notes (Signed)
Clinical Social Worker informed Field seismologistbyRichard Wieting,  MD that patient is medically ready to discharge to SNF, Patient and patient's daughter in a agreement with plan.  Call to SN[facility] [place] to [place] to confirm that patient's bed is ready. Did not Provided patient's room number but did give number to call for report [phone] . All discharge information faxed to  Facility.   RN will call report and patient will discharge [facility] via EMS.  Sammuel Hineseborah Floride Hutmacher. Theresia MajorsLCSWA, MSW Clinical Social Work Department [phone] 11:36 AM

## 2015-01-24 NOTE — Discharge Instructions (Signed)
Heart Failure Clinic appointment on February 04, 2015 at 9:00am with Kevin Kindredina Hackney, FNP. Please call [phone] to reschedule.  Chronic Kidney Disease Chronic kidney disease happens when the kidneys are damaged over a long period. The kidneys are two organs that do many important jobs in the body. These jobs include:  Removing wastes and extra fluids from the blood.  Making hormones that help to keep the body healthy.  Making sure that the body has the right amount of fluids and chemicals. Chronic kidney disease may be caused by many things. The kidney damage occurs slowly. If too much damage occurs, the kidneys may stop working the way that they should. This is dangerous. Treatment can help to slow down the damage and keep it from getting worse. HOME CARE  Follow your diet as told by your doctor. You may need to limit the amount of salt (sodium) and protein that you eat each day.  Take medicines only as told by your doctor. Do not take any new medicines unless your doctor approves it.  Quit smoking if you smoke. Talk to your doctor about programs that may help you quit smoking.  Have your blood pressure checked regularly and keep track of the results.  Start or keep doing an exercise plan.  Get shots (immunizations) as told by your doctor.  Take vitamins and minerals as told by your doctor.  Keep all follow-up visits as told by your doctor. This is important. GET HELP RIGHT AWAY IF:   Your symptoms get worse.  You have new symptoms.  You have symptoms of end-stage kidney disease. These include:  Headaches.  Skin that is darker or lighter than normal.  Numbness in the hands or feet.  Easy bruising.  Frequent hiccups.  Stopping of menstrual periods in women.  You have a fever.  You are making very little pee (urine).  You have pain or bleeding when you pee.   This information is not intended to replace advice given to you by your health care provider. Make sure  you discuss any questions you have with your health care provider.   Document Released: 05/05/2009 Document Revised: 10/30/2014 Document Reviewed: 10/08/2011 Elsevier Interactive Patient Education [employer]2016 Elsevier Inc.

## 2015-01-24 NOTE — NC FL2 (Signed)
[place] MEDICAID FL2 LEVEL OF CARE SCREENING TOOL     IDENTIFICATION  Patient Name: Kevin Singleton Birthdate: [birth year] Sex: male Admission Date (Current Location): 01/16/2015  [facility] [place]   Facility and Address:  [facility], [address], [place], [place] [address]      Provider Number: [id number]  Attending Physician Name and Address:  Alford Highlandichard Wieting, MD  Relative Name and Phone Number:       Current Level of Care: Hospital Recommended Level of Care: Skilled Nursing Facility Prior Approval Number:    Date Approved/Denied:   PASRR Number:   [id number] [id number]    Discharge Plan: SNF    Current Diagnoses: Patient Active Problem List   Diagnosis Date Noted  . Fluid overload 01/16/2015    Orientation ACTIVITIES/SOCIAL BLADDER RESPIRATION    Self  Active Continent, Indwelling catheter O2 (As needed)  BEHAVIORAL SYMPTOMS/MOOD NEUROLOGICAL BOWEL NUTRITION STATUS   (none)  (none) Continent Diet  PHYSICIAN VISITS COMMUNICATION OF NEEDS Height & Weight Skin  30 days Verbally 5\' 11"  (180.3 cm) 228 lbs. Normal          AMBULATORY STATUS RESPIRATION    Assist extensive O2 (As needed)      Personal Care Assistance Level of Assistance  Bathing, Dressing, Feeding Bathing Assistance: Maximum assistance Feeding assistance: Limited assistance Dressing Assistance: Maximum assistance      Functional Limitations Info  Sight Sight Info: Impaired           SPECIAL CARE FACTORS FREQUENCY  PT (By licensed PT)                   Additional Factors Info  Code Status, Allergies Code Status Info: Full Allergies Info: neosporin           Current Medications (01/24/2015):  This is the current hospital active medication list Current Facility-Administered Medications  Medication Dose Route Frequency Provider Last Rate Last Dose  . amLODipine (NORVASC) tablet 10 mg  10 mg Oral Daily Alford Highlandichard Wieting, MD   10 mg at  01/24/15 1000  . antiseptic oral rinse (CPC / CETYLPYRIDINIUM CHLORIDE 0.05%) solution 7 mL  7 mL Mouth Rinse BID Sital Mody, MD   7 mL at 01/23/15 2200  . aspirin chewable tablet 81 mg  81 mg Oral Daily Altamese DillingVaibhavkumar Vachhani, MD   81 mg at 01/24/15 0917  . furosemide (LASIX) tablet 80 mg  80 mg Oral BID Munsoor Lateef, MD   80 mg at 01/24/15 0917  . glipiZIDE (GLUCOTROL) tablet 5 mg  5 mg Oral QAC breakfast Alford Highlandichard Wieting, MD   5 mg at 01/24/15 [id number]  . heparin injection 5,000 Units  5,000 Units Subcutaneous 3 times per day Altamese DillingVaibhavkumar Vachhani, MD   5,000 Units at 01/24/15 [id number]  . hydrALAZINE (APRESOLINE) injection 10 mg  10 mg Intravenous Q6H PRN Adrian SaranSital Mody, MD      . hydrALAZINE (APRESOLINE) tablet 25 mg  25 mg Oral 4 times per day Alford Highlandichard Wieting, MD   25 mg at 01/24/15 0612  . insulin aspart (novoLOG) injection 0-9 Units  0-9 Units Subcutaneous TID AC & HS Oralia Manisavid Willis, MD   2 Units at 01/24/15 [phone]  . ipratropium-albuterol (DUONEB) 0.5-2.5 (3) MG/3ML nebulizer solution 3 mL  3 mL Nebulization Q6H PRN Vishal Mungal, MD   3 mL at 01/20/15 0747  . magnesium oxide (MAG-OX) tablet 400 mg  400 mg Oral BID Alford Highlandichard Wieting, MD   400 mg at 01/24/15 0917  . metoprolol (  LOPRESSOR) tablet 50 mg  50 mg Oral BID Adrian Saran, MD   50 mg at 01/24/15 1000  . nitroGLYCERIN (NITROSTAT) SL tablet 0.4 mg  0.4 mg Sublingual Q5 min PRN Adrian Saran, MD   0.4 mg at 01/22/15 0243  . [START ON 01/25/2015] predniSONE (DELTASONE) tablet 5 mg  5 mg Oral Q breakfast Alford Highland, MD      . senna-docusate (Senokot-S) tablet 2 tablet  2 tablet Oral QHS Alford Highland, MD   2 tablet at 01/23/15 2145  . sevelamer carbonate (RENVELA) tablet 800 mg  800 mg Oral TID WC Munsoor Lateef, MD   800 mg at 01/24/15 0917  . sodium chloride 0.9 % injection 3 mL  3 mL Intravenous PRN Alford Highland, MD   3 mL at 01/22/15 0608     Discharge Medications: Please see discharge summary for a list of discharge medications.  Relevant  Imaging Results:  Relevant Lab Results:  Recent Labs    Additional Information SS: [id number]  Soundra Pilon, LCSW

## 2015-01-24 NOTE — Progress Notes (Signed)
SATURATION QUALIFICATIONS: (This note is used to comply with regulatory documentation for home oxygen)  Patient Saturations on Room Air at Rest =86%   

## 2015-01-24 NOTE — Progress Notes (Signed)
[facility] [place] [facility] Pt reports no pain. A & O. IV and tele removed. Takes meds ok. 1 L of oxygen. NSR. Up to chair and tolerated it well. EMS to take pt to SNF. Daughter was updated on care plan.

## 2015-01-24 NOTE — Care Management Important Message (Signed)
Important Message  Patient Details  Name: Kevin Singleton MRN: [id number] Date of Birth: [birth year]   Medicare Important Message Given:  Yes    [name] 01/24/2015, 10:29 AM

## 2015-01-24 NOTE — Discharge Summary (Signed)
[facility] [place] at [facility]   PATIENT NAME: Kevin Singleton    MR#:  [id number]  DATE OF BIRTH:  September 26, 1940  DATE OF ADMISSION:  01/16/2015 ADMITTING PHYSICIAN: Altamese Dilling, MD  DATE OF DISCHARGE: 01/24/2015  PRIMARY CARE PHYSICIAN: Donell Sievert, MD    ADMISSION DIAGNOSIS:  Pulmonary edema [J81.1]  DISCHARGE DIAGNOSIS:  Principal Problem:   Fluid overload   SECONDARY DIAGNOSIS:   Past Medical History  Diagnosis Date  . Chronic kidney disease   . Coronary artery disease   . Hypertension   . Diabetes mellitus without complication (HCC)   . Hyperlipidemia     HOSPITAL COURSE:   1. Acute hypoxic respiratory failure. Patient was admitted with CHF and required diuresis. He decompensated during the hospital course and needed to be transferred to the ICU and placed on Lasix drip. Patient improved with his oxygen saturations and is now on nasal cannula. Pulse ox on room air is 87% so he was still require oxygen 2 L nasal cannula 24/ 7. 2. Acute diastolic congestive heart failure- patient was diuresed with Lasix drip during the hospital course. He is on Lasix 80 mg twice a day. 3. Accelerated hypertension- blood pressure is much better controlled at this point 4. Acute renal failure on chronic kidney disease. Creatinine has worsened from baseline and has stayed this way during the entire hospital course. Nephrology that saw him in consultation here did not want to start dialysis at this point. His GFR is 13 and creatinine upon discharge is 4.46. The patient follows with Dr. Signe Colt at [facility] and will need follow-up next week. Recommend checking a BMP weekly with results to go to Dr. Rennie Plowman nephrology. 5. Type 2 diabetes without complication- sugars became high with the steroids that I had to give. Patient will go out of the hospital on low-dose insulin prior to meals and glipizide 5 mg daily 6. Acute gout attack left ankle- patient given a prednisone  taper and low-dose allopurinol upon discharge 7. Anemia of chronic disease- patient was transfused 1 unit of packed red blood cells during the hospital course and hemoglobin came up to 8. Nephrology consult consider Procrit as outpatient   DISCHARGE CONDITIONS:   Fair  CONSULTS OBTAINED:  Treatment Team:  Mady Haagensen, MD  DRUG ALLERGIES:   Allergies  Allergen Reactions  . Neosporin [Neomycin-Bacitracin Zn-Polymyx] Swelling and Rash    DISCHARGE MEDICATIONS:   Current Discharge Medication List    START taking these medications   Details  allopurinol (ZYLOPRIM) 100 MG tablet Take 0.5 tablets (50 mg total) by mouth daily.    amLODipine (NORVASC) 10 MG tablet Take 1 tablet (10 mg total) by mouth daily.    docusate sodium (COLACE) 250 MG capsule Take 1 capsule (250 mg total) by mouth 2 (two) times daily as needed for constipation. Qty: 10 capsule, Refills: 0    hydrALAZINE (APRESOLINE) 25 MG tablet Take 1 tablet (25 mg total) by mouth every 6 (six) hours.    insulin aspart (NOVOLOG) 100 UNIT/ML injection Inject 4 Units into the skin 4 (four) times daily -  before meals and at bedtime. Qty: 10 mL, Refills: 11    ipratropium-albuterol (DUONEB) 0.5-2.5 (3) MG/3ML SOLN Take 3 mLs by nebulization every 6 (six) hours as needed. Qty: 360 mL    magnesium oxide (MAG-OX) 400 (241.3 MG) MG tablet Take 1 tablet (400 mg total) by mouth daily.    metoprolol (LOPRESSOR) 50 MG tablet Take 1 tablet (50 mg total)  by mouth 2 (two) times daily.    predniSONE (DELTASONE) 5 MG tablet 1 tab daily for one day, 1/2 tab daily for two days then stop Qty: 2 tablet, Refills: 0    sevelamer carbonate (RENVELA) 800 MG tablet Take 1 tablet (800 mg total) by mouth 3 (three) times daily with meals.      CONTINUE these medications which have CHANGED   Details  furosemide (LASIX) 80 MG tablet Take 1 tablet (80 mg total) by mouth 2 (two) times daily. Qty: 30 tablet    glipiZIDE (GLUCOTROL) 5 MG  tablet Take 1 tablet (5 mg total) by mouth daily before breakfast.      CONTINUE these medications which have NOT CHANGED   Details  aspirin 81 MG tablet Take 81 mg by mouth daily.      STOP taking these medications     amLODipine-benazepril (LOTREL) 10-20 MG capsule      carvedilol (COREG) 25 MG tablet      clopidogrel (PLAVIX) 75 MG tablet      gabapentin (NEURONTIN) 300 MG capsule          DISCHARGE INSTRUCTIONS:   Follow-up with Dr. rehabilitation 1 day Follow-up with Dr. Signe Colt nephrology one week  If you experience worsening of your admission symptoms, develop shortness of breath, life threatening emergency, suicidal or homicidal thoughts you must seek medical attention immediately by calling 911 or calling your MD immediately  if symptoms less severe.  You Must read complete instructions/literature along with all the possible adverse reactions/side effects for all the Medicines you take and that have been prescribed to you. Take any new Medicines after you have completely understood and accept all the possible adverse reactions/side effects.   Please note  You were cared for by a hospitalist during your hospital stay. If you have any questions about your discharge medications or the care you received while you were in the hospital after you are discharged, you can call the unit and asked to speak with the hospitalist on call if the hospitalist that took care of you is not available. Once you are discharged, your primary care physician will handle any further medical issues. Please note that NO REFILLS for any discharge medications will be authorized once you are discharged, as it is imperative that you return to your primary care physician (or establish a relationship with a primary care physician if you do not have one) for your aftercare needs so that they can reassess your need for medications and monitor your lab values.    Today   CHIEF COMPLAINT:   Chief Complaint   Patient presents with  . Shortness of Breath    HISTORY OF PRESENT ILLNESS:  Kevin Singleton  is a 74 y.o. male with a known history of chronic kidney disease presented with shortness of breath and found to be in acute hypoxic respiratory failure with congestive heart failure.   VITAL SIGNS:  Blood pressure 133/75, pulse 76, temperature 98.2 F (36.8 C), temperature source Oral, resp. rate 18, height  (1.803 m), weight 103.012 kg (227 lb 1.6 oz), SpO2 90 %.    PHYSICAL EXAMINATION:  GENERAL:  74 y.o.-year-old patient lying in the bed with no acute distress.  EYES: Pupils equal, round, reactive to light and accommodation. No scleral icterus. Extraocular muscles intact.  HEENT: Head atraumatic, normocephalic. Oropharynx and nasopharynx clear.  NECK:  Supple, no jugular venous distention. No thyroid enlargement, no tenderness.  LUNGS: Decreased breath sounds bilaterally, no wheezing,rhonchi or  crepitation. Rales at the bases No use of accessory muscles of respiration.  CARDIOVASCULAR: S1, S2 normal. 26 systolic murmurs, no rubs, or gallops.  ABDOMEN: Soft, non-tender, non-distended. Bowel sounds present. No organomegaly or mass.  EXTREMITIES: 3+ pedal edema, no cyanosis, or clubbing. Better range of motion left ankle. Without pain NEUROLOGIC: Cranial nerves II through XII are intact. Gait not checked.  PSYCHIATRIC: The patient is alert.  SKIN: No obvious rash, lesion, or ulcer.   DATA REVIEW:   CBC  Recent Labs Lab 01/24/15 0435  WBC 12.7*  HGB 8.0*  HCT 24.1*  PLT 249    Chemistries   Recent Labs Lab 01/22/15 0416  01/24/15 0435  NA 142  < > 141  K 3.4*  < > 3.7  CL 103  < > 102  CO2 30  < > 29  GLUCOSE 176*  < > 181*  BUN 72*  < > 83*  CREATININE 4.23*  < > 4.46*  CALCIUM 6.3*  < > 7.1*  MG 1.5*  --   --   < > = values in this interval not displayed.  Cardiac Enzymes  Recent Labs Lab 01/18/15 1517  TROPONINI 0.03    Microbiology Results  Results for  orders placed or performed during the hospital encounter of 01/16/15  MRSA PCR Screening     Status: None   Collection Time: 01/18/15  3:10 PM  Result Value Ref Range Status   MRSA by PCR NEGATIVE NEGATIVE Final    Comment:        The GeneXpert MRSA Assay (FDA approved for NASAL specimens only), is one component of a comprehensive MRSA colonization surveillance program. It is not intended to diagnose MRSA infection nor to guide or monitor treatment for MRSA infections.     RADIOLOGY:  [place] Renal  01/22/2015  CLINICAL DATA:  Acute renal failure EXAM: RENAL / URINARY TRACT ULTRASOUND COMPLETE COMPARISON:  None. FINDINGS: Right Kidney: Length: 9.6 cm. Moderately increased echogenicity. No hydronephrosis or mass. Left Kidney: Length: 10.3 cm. Moderately increased echogenicity. No hydronephrosis or mass. Bladder: Decompressed by Foley catheter and therefore not evaluated. Incidentally noted are bilateral pleural effusions. IMPRESSION: 1. Medical renal disease 2. Bilateral pleural effusions Electronically Signed   By: Esperanza Heiraymond  Rubner M.D.   On: 01/22/2015 11:45   Management plans discussed with the patient, family and they are in agreement.  CODE STATUS:     Code Status Orders        Start     Ordered   01/16/15 2304  Full code   Continuous     01/16/15 2303      TOTAL TIME TAKING CARE OF THIS PATIENT: 35 minutes.    Alford HighlandWIETING, Kentarius Partington M.D on 01/24/2015 at 9:24 AM  Between 7am to 6pm - Pager - [phone]  After 6pm go to [email or web address] - password [email or web address] [email or web address]  [facility]  Office  [phone]  CC: Primary care physician; Donell SievertMILLER,AARON, MD

## 2015-02-04 ENCOUNTER — Ambulatory Visit: Payer: Medicare Other | Admitting: Family

## 2015-02-20 ENCOUNTER — Ambulatory Visit: Payer: Medicare Other | Attending: Family | Admitting: Family

## 2015-02-20 ENCOUNTER — Encounter: Payer: Self-pay | Admitting: Family

## 2015-02-20 VITALS — BP 145/68 | HR 79 | Resp 18 | Ht 71.0 in | Wt 232.0 lb

## 2015-02-20 DIAGNOSIS — I5032 Chronic diastolic (congestive) heart failure: Secondary | ICD-10-CM | POA: Diagnosis present

## 2015-02-20 DIAGNOSIS — I2510 Atherosclerotic heart disease of native coronary artery without angina pectoris: Secondary | ICD-10-CM | POA: Diagnosis not present

## 2015-02-20 DIAGNOSIS — N183 Chronic kidney disease, stage 3 unspecified: Secondary | ICD-10-CM | POA: Insufficient documentation

## 2015-02-20 DIAGNOSIS — Z888 Allergy status to other drugs, medicaments and biological substances status: Secondary | ICD-10-CM | POA: Insufficient documentation

## 2015-02-20 DIAGNOSIS — I130 Hypertensive heart and chronic kidney disease with heart failure and stage 1 through stage 4 chronic kidney disease, or unspecified chronic kidney disease: Secondary | ICD-10-CM | POA: Insufficient documentation

## 2015-02-20 DIAGNOSIS — Z79899 Other long term (current) drug therapy: Secondary | ICD-10-CM | POA: Diagnosis not present

## 2015-02-20 DIAGNOSIS — E785 Hyperlipidemia, unspecified: Secondary | ICD-10-CM | POA: Insufficient documentation

## 2015-02-20 DIAGNOSIS — Z794 Long term (current) use of insulin: Secondary | ICD-10-CM | POA: Insufficient documentation

## 2015-02-20 DIAGNOSIS — E1122 Type 2 diabetes mellitus with diabetic chronic kidney disease: Secondary | ICD-10-CM

## 2015-02-20 DIAGNOSIS — Z7982 Long term (current) use of aspirin: Secondary | ICD-10-CM | POA: Diagnosis not present

## 2015-02-20 DIAGNOSIS — E119 Type 2 diabetes mellitus without complications: Secondary | ICD-10-CM | POA: Insufficient documentation

## 2015-02-20 DIAGNOSIS — I509 Heart failure, unspecified: Secondary | ICD-10-CM | POA: Insufficient documentation

## 2015-02-20 DIAGNOSIS — Z9889 Other specified postprocedural states: Secondary | ICD-10-CM | POA: Insufficient documentation

## 2015-02-20 DIAGNOSIS — I10 Essential (primary) hypertension: Secondary | ICD-10-CM

## 2015-02-20 MED ORDER — FUROSEMIDE 80 MG PO TABS: 80.0000 mg | ORAL_TABLET | Freq: Two times a day (BID) | ORAL | Status: AC

## 2015-02-20 MED ORDER — FUROSEMIDE 80 MG PO TABS: 80.0000 mg | ORAL_TABLET | Freq: Two times a day (BID) | ORAL | Status: DC

## 2015-02-20 NOTE — Patient Instructions (Addendum)
Begin weighing daily and call for an overnight weight gain of > 2 pounds or a weekly weight gain of >5 pounds. 

## 2015-02-20 NOTE — Progress Notes (Signed)
Subjective:    Patient ID: Kevin Singleton, male    DOB: Jul 19, 1940, 74 y.o.   MRN: [id number]  Congestive Heart Failure Presents for initial visit. The disease course has been improving. Associated symptoms include edema, fatigue and shortness of breath (minimal). Pertinent negatives include no abdominal pain, chest pain, chest pressure, orthopnea or palpitations. The symptoms have been improving. Past treatments include beta blockers and salt and fluid restriction. The treatment provided moderate relief. Compliance with prior treatments has been good. His past medical history is significant for CAD, DM and HTN. Compliance with total regimen is 76-100%.  Hypertension This is a chronic problem. The current episode started more than 1 year ago. The problem is unchanged. The problem is controlled. Associated symptoms include peripheral edema and shortness of breath (minimal). Pertinent negatives include no chest pain, headaches, neck pain or palpitations. There are no associated agents to hypertension. Risk factors for coronary artery disease include diabetes mellitus, dyslipidemia, male gender and sedentary lifestyle. Past treatments include beta blockers, calcium channel blockers, diuretics and lifestyle changes. The current treatment provides moderate improvement. Compliance problems include exercise.  Hypertensive end-organ damage includes kidney disease, CAD/MI and heart failure.  Other This is a chronic (edema) problem. The current episode started more than 1 month ago. The problem occurs daily. The problem has been gradually improving. Associated symptoms include fatigue. Pertinent negatives include no abdominal pain, chest pain, congestion, coughing, headaches, neck pain or sore throat. The symptoms are aggravated by standing and walking. He has tried position changes for the symptoms. The treatment provided mild relief.    Past Medical History  Diagnosis Date  . Chronic kidney disease   . Coronary  artery disease   . Hypertension   . Diabetes mellitus without complication (HCC)   . Hyperlipidemia    Past Surgical History  Procedure Laterality Date  . Cardiac catheterization    . Coronary angioplasty    . Hernia repair      Family History  Problem Relation Age of Onset  . Breast cancer Mother   . Stroke Father   . Cancer Sister     Ovarian  . Cancer Brother     Prostate  . Cancer Sister     Ovarian    Social History  Substance Use Topics  . Smoking status: Never Smoker   . Smokeless tobacco: Never Used  . Alcohol Use: No    Allergies  Allergen Reactions  . Neosporin [Neomycin-Bacitracin Zn-Polymyx] Swelling and Rash    Prior to Admission medications   Medication Sig Start Date End Date Taking? Authorizing Provider  allopurinol (ZYLOPRIM) 100 MG tablet Take 0.5 tablets (50 mg total) by mouth daily. 01/24/15  Yes Richard Renae Gloss, MD  amLODipine (NORVASC) 10 MG tablet Take 1 tablet (10 mg total) by mouth daily. 01/24/15  Yes Alford Highland, MD  aspirin 81 MG tablet Take 81 mg by mouth daily.   Yes Historical Provider, MD  carvedilol (COREG) 12.5 MG tablet Take 12.5 mg by mouth every 12 (twelve) hours.   Yes Historical Provider, MD  furosemide (LASIX) 80 MG tablet Take 1 tablet (80 mg total) by mouth 2 (two) times daily. 02/20/15  Yes Delma Freeze, FNP  glipiZIDE (GLUCOTROL) 5 MG tablet Take 1 tablet (5 mg total) by mouth daily before breakfast. 01/24/15  Yes Alford Highland, MD  hydrALAZINE (APRESOLINE) 25 MG tablet Take 1 tablet (25 mg total) by mouth every 6 (six) hours. 01/24/15  Yes Alford Highland, MD  insulin aspart (NOVOLOG)  100 UNIT/ML injection Inject 4 Units into the skin 4 (four) times daily -  before meals and at bedtime. 01/24/15  Yes Alford Highland, MD  magnesium oxide (MAG-OX) 400 (241.3 MG) MG tablet Take 1 tablet (400 mg total) by mouth daily. 01/24/15  Yes Alford Highland, MD  sevelamer carbonate (RENVELA) 800 MG tablet Take 1 tablet (800 mg total) by  mouth 3 (three) times daily with meals. 01/24/15  Yes Alford Highland, MD     Review of Systems  Constitutional: Positive for fatigue. Negative for appetite change.  HENT: Negative for congestion, postnasal drip and sore throat.   Eyes: Positive for visual disturbance (blurry vision). Negative for pain.  Respiratory: Positive for shortness of breath (minimal). Negative for cough, chest tightness and wheezing.   Cardiovascular: Positive for leg swelling. Negative for chest pain and palpitations.  Gastrointestinal: Negative for abdominal pain and abdominal distention.  Endocrine: Negative.   Genitourinary: Negative.   Musculoskeletal: Negative for back pain and neck pain.  Skin: Negative.   Allergic/Immunologic: Negative.   Neurological: Negative for dizziness, light-headedness and headaches.  Hematological: Negative for adenopathy. Does not bruise/bleed easily.  Psychiatric/Behavioral: Negative for sleep disturbance (sleeping on 1 pillow) and dysphoric mood. The patient is not nervous/anxious.        Objective:   Physical Exam  Constitutional: He is oriented to person, place, and time. He appears well-developed and well-nourished.  HENT:  Head: Normocephalic and atraumatic.  Eyes: Conjunctivae are normal. Pupils are equal, round, and reactive to light.  Neck: Normal range of motion. Neck supple.  Cardiovascular: Normal rate and regular rhythm.   No murmur heard. Pulmonary/Chest: Effort normal. He has no wheezes. He has no rales.  Abdominal: Soft. He exhibits no distension. There is no tenderness.  Musculoskeletal: He exhibits edema (2+ pitting edema in bilateral lower legs). He exhibits no tenderness.  Neurological: He is alert and oriented to person, place, and time.  Skin: Skin is warm and dry.  Psychiatric: He has a normal mood and affect. His behavior is normal. Thought content normal.  Nursing note and vitals reviewed.   BP 145/68 mmHg  Pulse 79  Resp 18  Ht   (1.803 m)  Wt 232 lb (105.235 kg)  BMI 32.37 kg/m2  SpO2 98%       Assessment & Plan:  1: Chronic heart failure with preserved ejection fraction- Patient presents with fatigue and a mild amount of shortness of breath. Does continue to have swelling in his legs and a new prescription was provided for his  furosemide. Encouraged him to also elevate his legs when he's sitting down for long periods of time. He doesn't weigh himself as he doesn't have any scales so a set was given to him today. Discussed the importance of weighing first thing in the morning after using the bathroom and to call for an overnight weight gain of >2 pounds or a weekly weight gain of >5 pounds. He doesn't add salt to his food and his food isn't cooked with salt either. Discussed the importance of following a low sodium diet and written information was given to him about that. He will be [facility] with [facility] February 2017. He understands that once he starts with them, that he will not be able to be seen in the Heart Failure Clinic. 2: HTN- Blood pressure looks good today. Continue medications. 3: Chronic kidney disease- He follows closely with nephrology and is adamant that he does not want to pursue dialysis or  a renal biopsy.  4: Diabetes- His daughter says that his glucose levels usually run 130-160's. Follows with his PCP regarding this.  Return here in 1 month or sooner for any questions/problems before then.

## 2015-03-20 ENCOUNTER — Ambulatory Visit: Payer: Medicare Other | Admitting: Family

## 2015-03-26 ENCOUNTER — Encounter: Payer: Self-pay | Admitting: Family

## 2015-03-26 ENCOUNTER — Ambulatory Visit: Payer: Medicare Other | Attending: Family | Admitting: Family

## 2015-03-26 VITALS — BP 144/73 | HR 77 | Resp 20 | Ht 71.0 in | Wt 227.0 lb

## 2015-03-26 DIAGNOSIS — I5032 Chronic diastolic (congestive) heart failure: Secondary | ICD-10-CM | POA: Insufficient documentation

## 2015-03-26 DIAGNOSIS — I10 Essential (primary) hypertension: Secondary | ICD-10-CM

## 2015-03-26 DIAGNOSIS — Z7982 Long term (current) use of aspirin: Secondary | ICD-10-CM | POA: Diagnosis not present

## 2015-03-26 DIAGNOSIS — E1122 Type 2 diabetes mellitus with diabetic chronic kidney disease: Secondary | ICD-10-CM | POA: Insufficient documentation

## 2015-03-26 DIAGNOSIS — Z888 Allergy status to other drugs, medicaments and biological substances status: Secondary | ICD-10-CM | POA: Insufficient documentation

## 2015-03-26 DIAGNOSIS — Z9889 Other specified postprocedural states: Secondary | ICD-10-CM | POA: Insufficient documentation

## 2015-03-26 DIAGNOSIS — Z79899 Other long term (current) drug therapy: Secondary | ICD-10-CM | POA: Diagnosis not present

## 2015-03-26 DIAGNOSIS — I2510 Atherosclerotic heart disease of native coronary artery without angina pectoris: Secondary | ICD-10-CM | POA: Insufficient documentation

## 2015-03-26 DIAGNOSIS — I130 Hypertensive heart and chronic kidney disease with heart failure and stage 1 through stage 4 chronic kidney disease, or unspecified chronic kidney disease: Secondary | ICD-10-CM | POA: Diagnosis not present

## 2015-03-26 DIAGNOSIS — N189 Chronic kidney disease, unspecified: Secondary | ICD-10-CM | POA: Insufficient documentation

## 2015-03-26 DIAGNOSIS — E785 Hyperlipidemia, unspecified: Secondary | ICD-10-CM | POA: Diagnosis not present

## 2015-03-26 DIAGNOSIS — N183 Chronic kidney disease, stage 3 (moderate): Secondary | ICD-10-CM

## 2015-03-26 NOTE — Progress Notes (Signed)
Subjective:    Patient ID: Kevin Singleton, male    DOB: May 31, 1940, 75 y.o.   MRN: [id number]  Congestive Heart Failure Presents for follow-up visit. The disease course has been stable. Associated symptoms include edema, fatigue and shortness of breath. Pertinent negatives include no abdominal pain, chest pain, chest pressure, orthopnea or palpitations. The symptoms have been stable. Past treatments include beta blockers and salt and fluid restriction. The treatment provided moderate relief. Compliance with prior treatments has been good. His past medical history is significant for CAD, DM and HTN. He has one 1st degree relative with heart disease. Compliance with total regimen is 76-100%.  Hypertension This is a chronic problem. The current episode started more than 1 year ago. The problem is unchanged. The problem is controlled. Associated symptoms include peripheral edema and shortness of breath. Pertinent negatives include no chest pain, headaches, neck pain or palpitations. There are no associated agents to hypertension. Risk factors for coronary artery disease include diabetes mellitus, male gender and obesity. Past treatments include beta blockers, diuretics and lifestyle changes. The current treatment provides moderate improvement. Hypertensive end-organ damage includes kidney disease, CAD/MI and heart failure.   Past Medical History  Diagnosis Date  . Chronic kidney disease   . Coronary artery disease   . Hypertension   . Diabetes mellitus without complication (HCC)   . Hyperlipidemia     Past Surgical History  Procedure Laterality Date  . Cardiac catheterization    . Coronary angioplasty    . Hernia repair      Family History  Problem Relation Age of Onset  . Breast cancer Mother   . Stroke Father   . Cancer Sister     Ovarian  . Cancer Brother     Prostate  . Cancer Sister     Ovarian    Social History  Substance Use Topics  . Smoking status: Never Smoker   . Smokeless  tobacco: Never Used  . Alcohol Use: No    Allergies  Allergen Reactions  . Neosporin [Neomycin-Bacitracin Zn-Polymyx] Swelling and Rash    Prior to Admission medications   Medication Sig Start Date End Date Taking? Authorizing Provider  glipiZIDE (GLUCOTROL) 5 MG tablet Take 5 mg by mouth daily before breakfast.   Yes Historical Provider, MD  allopurinol (ZYLOPRIM) 100 MG tablet Take 0.5 tablets (50 mg total) by mouth daily. 01/24/15   Alford Highland, MD  amLODipine (NORVASC) 10 MG tablet Take 1 tablet (10 mg total) by mouth daily. 01/24/15   Alford Highland, MD  aspirin 81 MG tablet Take 81 mg by mouth daily.    Historical Provider, MD  carvedilol (COREG) 12.5 MG tablet Take 12.5 mg by mouth every 12 (twelve) hours.    Historical Provider, MD  furosemide (LASIX) 80 MG tablet Take 1 tablet (80 mg total) by mouth 2 (two) times daily. 02/20/15   Delma Freeze, FNP  hydrALAZINE (APRESOLINE) 25 MG tablet Take 1 tablet (25 mg total) by mouth every 6 (six) hours. 01/24/15   Alford Highland, MD  magnesium oxide (MAG-OX) 400 (241.3 MG) MG tablet Take 1 tablet (400 mg total) by mouth daily. 01/24/15   Alford Highland, MD  sevelamer carbonate (RENVELA) 800 MG tablet Take 1 tablet (800 mg total) by mouth 3 (three) times daily with meals. 01/24/15   Alford Highland, MD      Review of Systems  Constitutional: Positive for fatigue. Negative for appetite change.  HENT: Negative for congestion, postnasal drip and sore throat.  Eyes: Negative.   Respiratory: Positive for shortness of breath. Negative for cough and chest tightness.   Cardiovascular: Positive for leg swelling (little bit). Negative for chest pain and palpitations.  Gastrointestinal: Negative for abdominal pain and abdominal distention.  Endocrine: Negative.   Genitourinary: Negative.   Musculoskeletal: Negative for back pain and neck pain.  Skin: Negative.   Allergic/Immunologic: Negative.   Neurological: Negative for dizziness,  numbness and headaches.  Hematological: Negative for adenopathy. Does not bruise/bleed easily.  Psychiatric/Behavioral: Negative for sleep disturbance (wear oxygen at night. sleeping 1-2 pillows) and dysphoric mood. The patient is not nervous/anxious.        Objective:   Physical Exam  Constitutional: He is oriented to person, place, and time. He appears well-developed and well-nourished.  HENT:  Head: Normocephalic and atraumatic.  Eyes: Conjunctivae are normal. Pupils are equal, round, and reactive to light.  Neck: Normal range of motion. Neck supple.  Cardiovascular: Normal rate and regular rhythm.   Pulmonary/Chest: Effort normal. He has no wheezes. He has no rales.  Abdominal: Soft. He exhibits no distension. There is no tenderness.  Musculoskeletal: He exhibits edema (trace amount pedal edema in bilateral lower legs). He exhibits no tenderness.  Neurological: He is alert and oriented to person, place, and time.  Skin: Skin is warm and dry.  Psychiatric: He has a normal mood and affect. His behavior is normal. Thought content normal.  Nursing note and vitals reviewed.   BP 144/73 mmHg  Pulse 77  Resp 20  Ht  (1.803 m)  Wt 227 lb (102.967 kg)  BMI 31.67 kg/m2  SpO2 98%       Assessment & Plan:  1: Chronic heart failure with preserved ejection fraction- Patient presents with fatigue and shortness of breath upon exertion. He says that he came to the office in a wheelchair because they came into the wrong entrance and he didn't think he could have walked that far. When he does experience symptoms, he will stop what he's doing to rest until his symptoms improve. He continues to weigh himself and says that he's noticed a gradual weight loss. By our scale, he's lost 5 pounds in the last month. Reminded to call for an overnight weight gain of >2 pounds or a weekly weight gain of >5 pounds. He is not adding salt to his food and his daughter says that they don't cook with it  either. Does have oxygen that he wears at 2L on an as needed basis. He did not bring his medications with him and says that "nothing has changed". Will get med list sent from pharmacy but encouraged them to bring the bottles to every visit. 2: HTN- Blood pressure looks good today. Continue medications at this time.  3: Diabetes- He says that he's not diabetic but his med list shows glipizide. Again, will look at pharmacy med list when it's received. Does follow with his PCP regarding this.  Return in 3 months or sooner for any questions/problems before then. Patient's daughter did not want to schedule it at this time and says that she will call as it gets closer due to her work schedule.

## 2015-03-26 NOTE — Patient Instructions (Addendum)
Continue weighing daily and call for an overnight weight gain of > 2 pounds or a weekly weight gain of >5 pounds.   Needs to be seen again in May 2017. Call as it gets closer to schedule the appointment due to work conflicts.

## 2016-02-11 IMAGING — CR DG CHEST 2V
1 series · 2 of 2 positions shown · non-contrast
Comparison: 01/16/2015

CLINICAL DATA: Shortness of breath for 3-4 days. History of chronic
renal disease.

EXAM:
CHEST  2 VIEW

[Series 1: dg chest 2 view · 0.14mm/px · 2 of 2 slices shown]
[im 1/2]
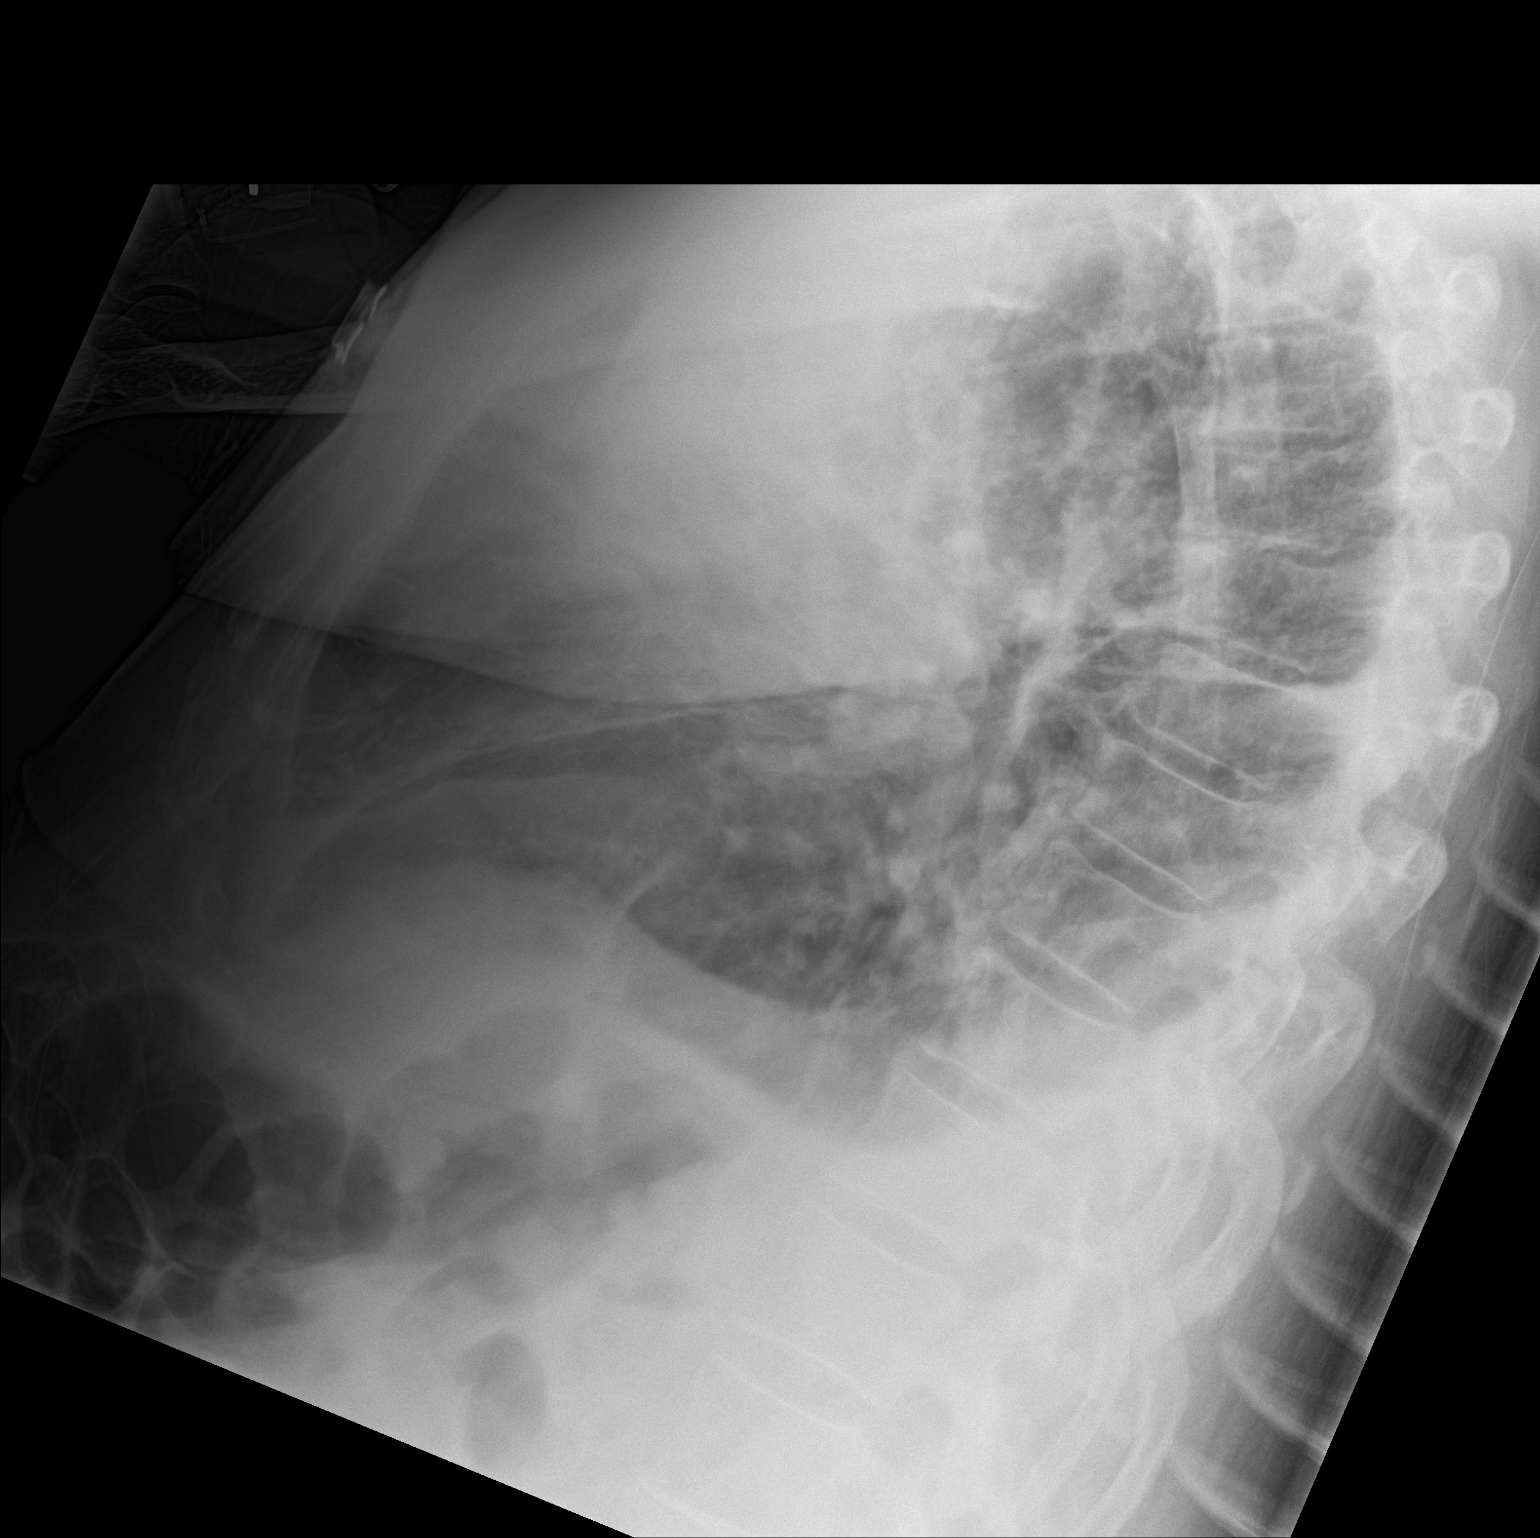
[im 2/2]
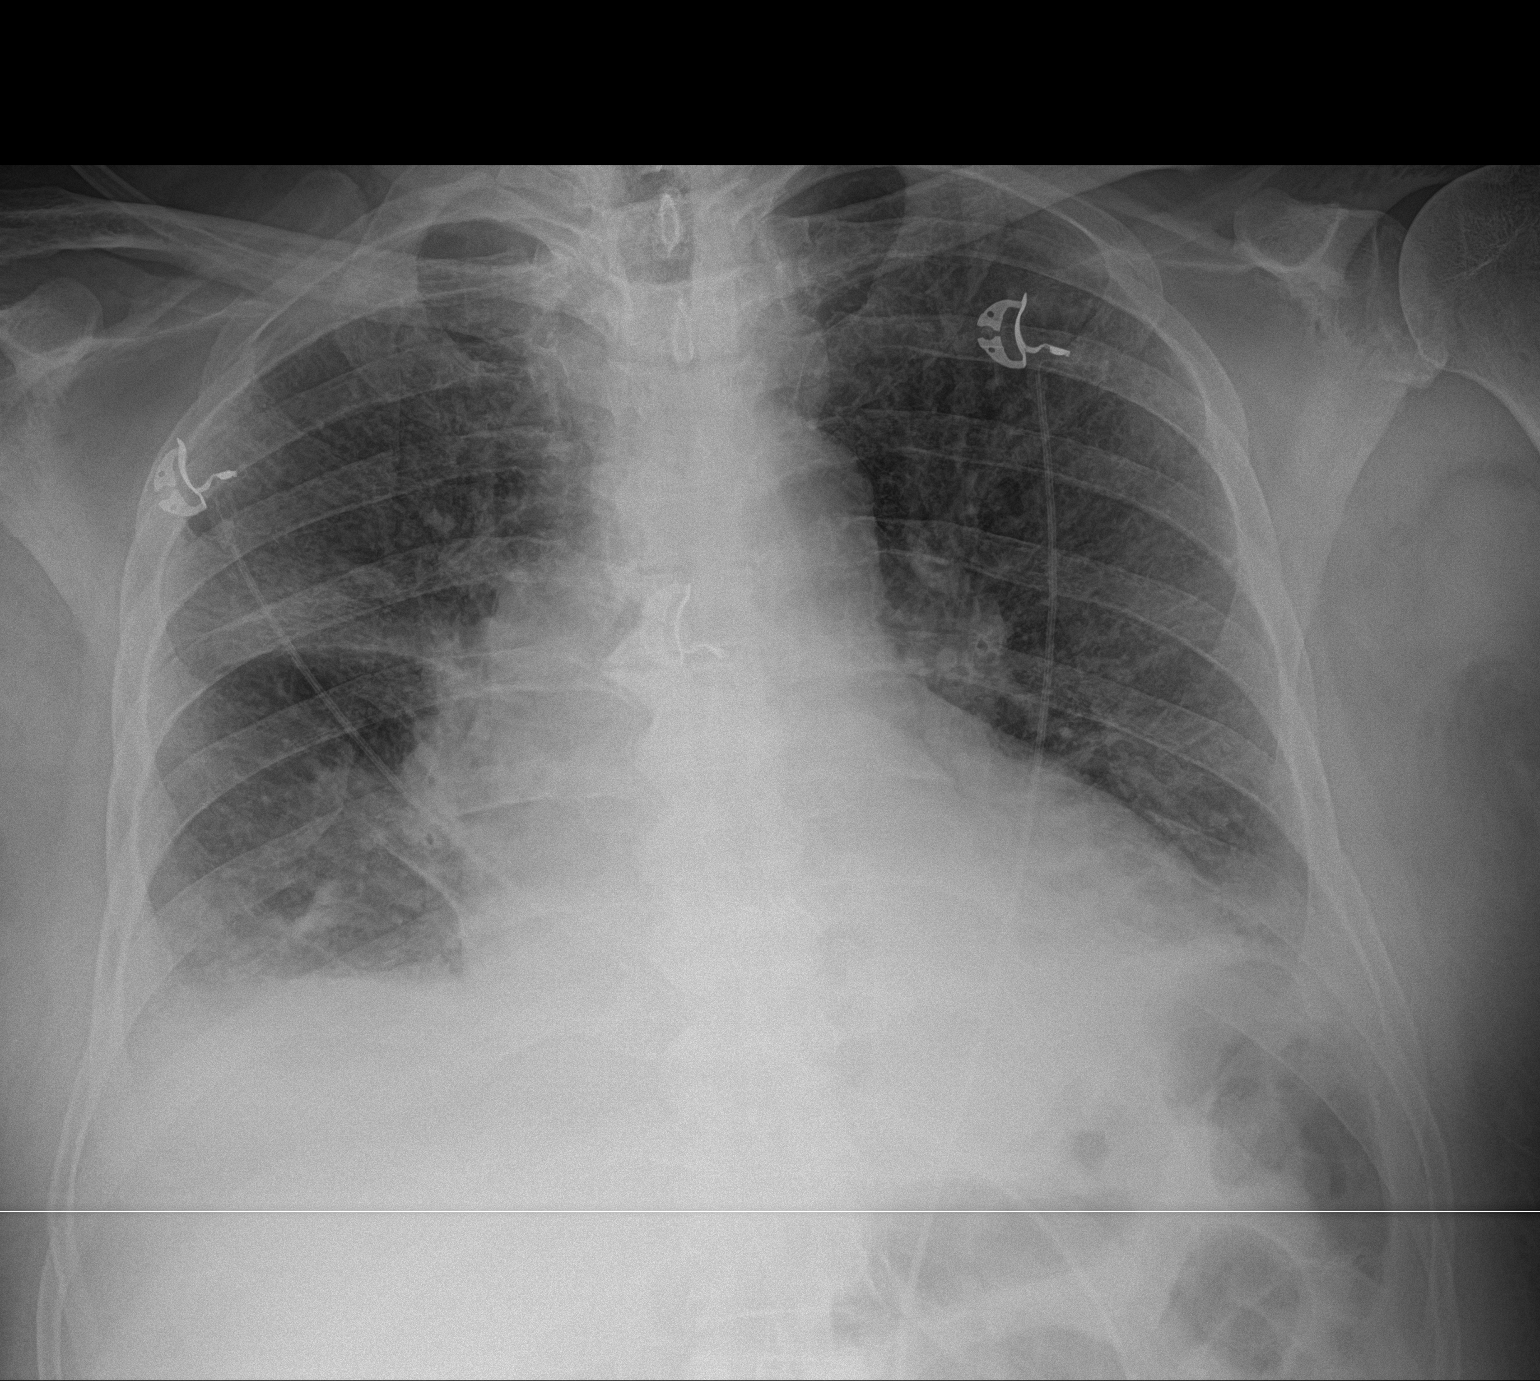

[2 of 2 positions shown; findings below may reference images not displayed]

FINDINGS: Cardiomediastinal silhouette is increased. Mediastinal contours
appear intact. There is fullness of bilateral hilar regions,
particularly on the right.

There is no evidence of pneumothorax. There is persistent increase
of the interstitial markings, and small bilateral pleural effusions
with fluid tracking along the right minor fissure. Bibasilar
airspace opacities may represent atelectasis versus airspace
consolidation.

Osseous structures are without acute abnormality. Stigmata of
diffuse idiopathic skeletal hyperostosis of the thoracic spine is
seen. Soft tissues are grossly normal.
IMPRESSION: Enlarged cardiac silhouette.

Persistent pulmonary edema with bilateral small pleural effusions.

Bibasilar airspace opacities may represent atelectasis versus
airspace consolidation.

Fullness of bilateral hilar regions, worse on the right. This may be
due to pulmonary vascular congestion, however space-occupying lesion
cannot be excluded. Attention on future radiographs recommended.

## 2017-08-24 IMAGING — US US RENAL
1 series · 14 of 25 positions shown · non-contrast
Comparison: None.

CLINICAL DATA: Acute renal failure

EXAM:
RENAL / URINARY TRACT ULTRASOUND COMPLETE

[Series 1: us renal · 0.28mm/px · 14 of 26 slices shown]
[im 1/26]
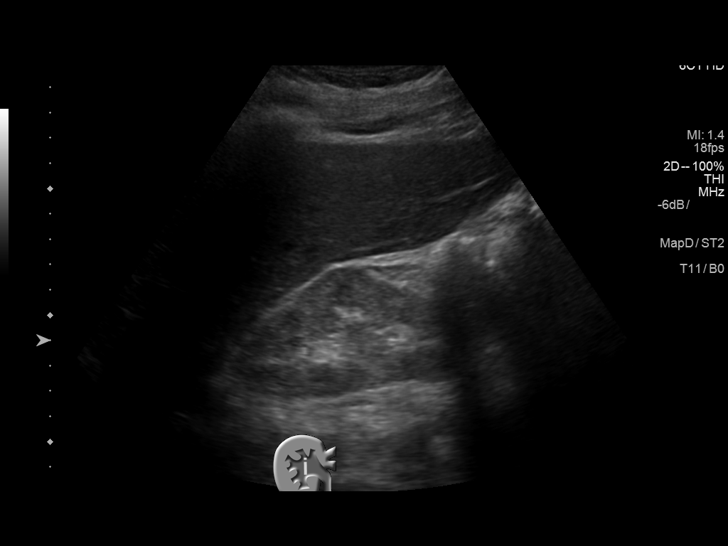
[im 3/26]
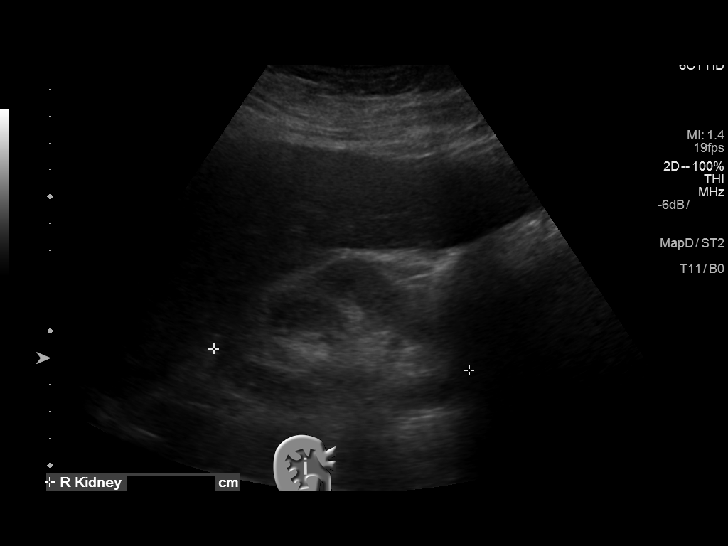
[im 5/26]
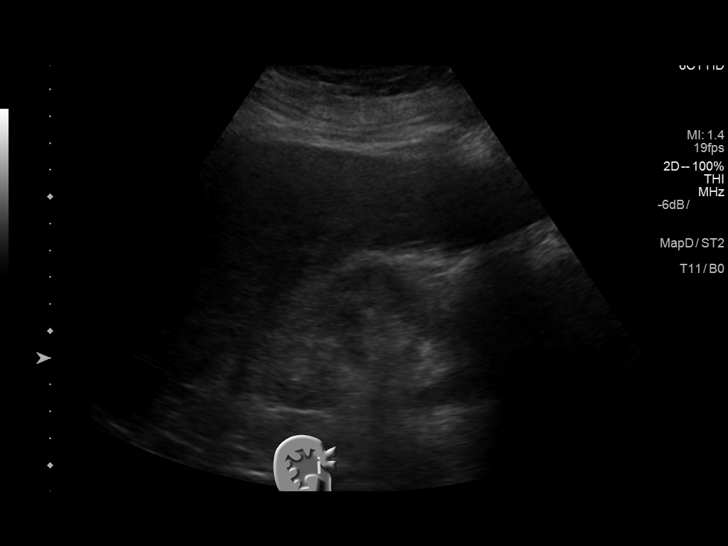
[im 7/26]
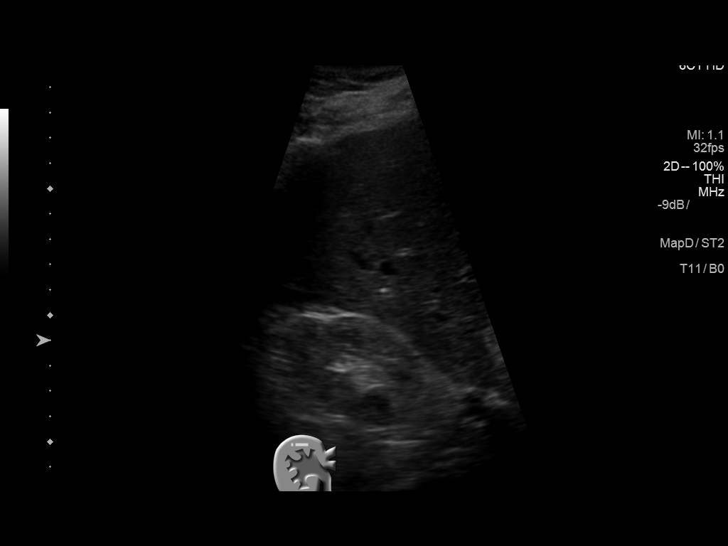
[im 9/26]
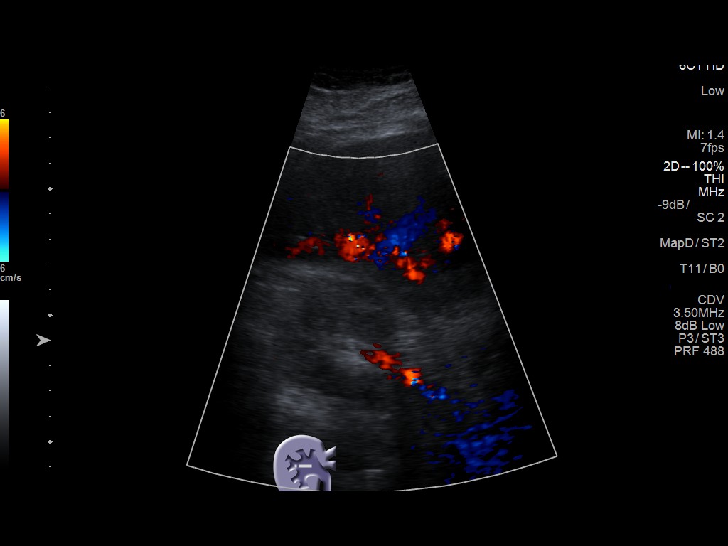
[im 10/26]
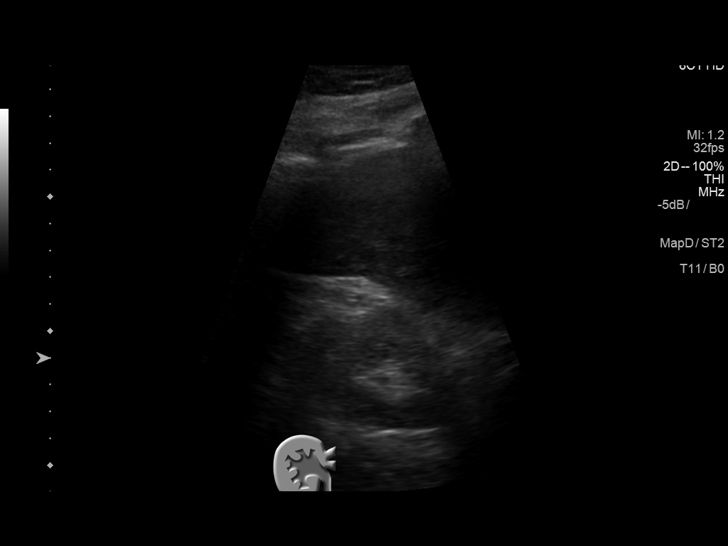
[im 12/26]
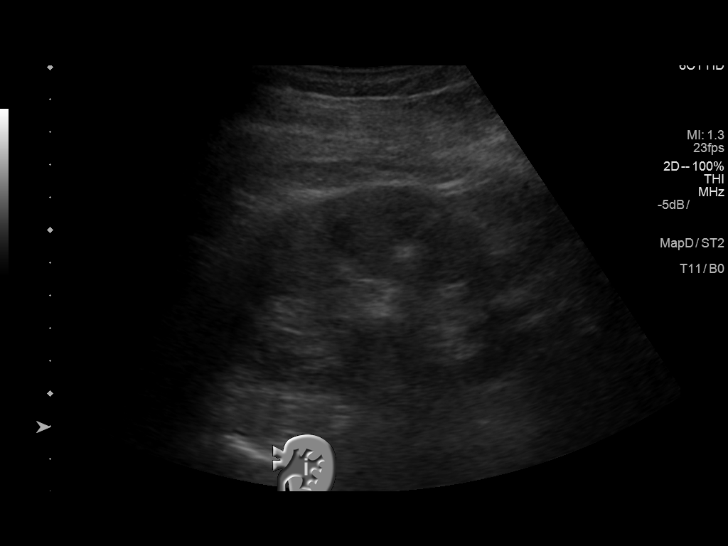
[im 14/26]
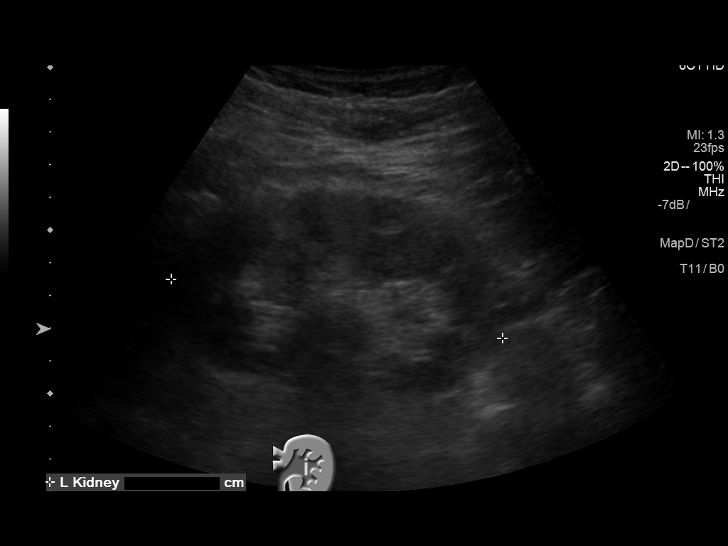
[im 16/26]
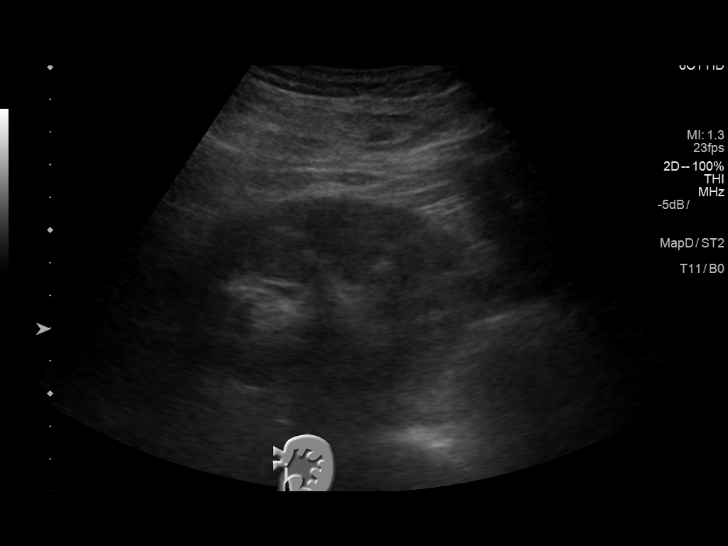
[im 17/26]
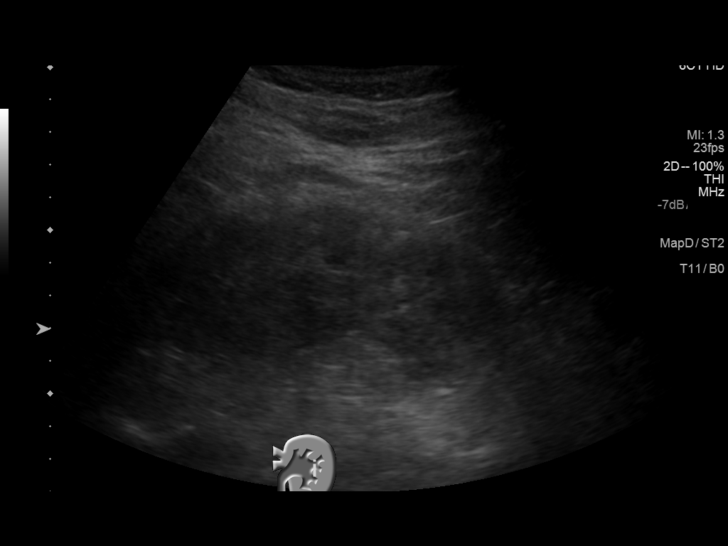
[im 19/26]
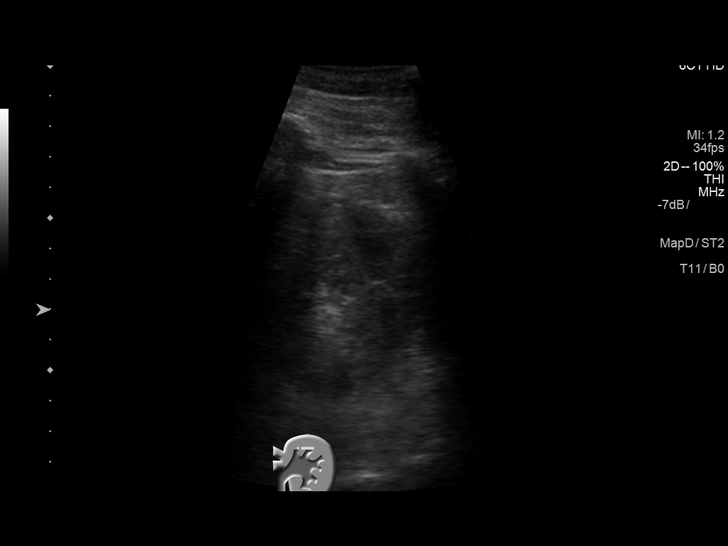
[im 21/26]
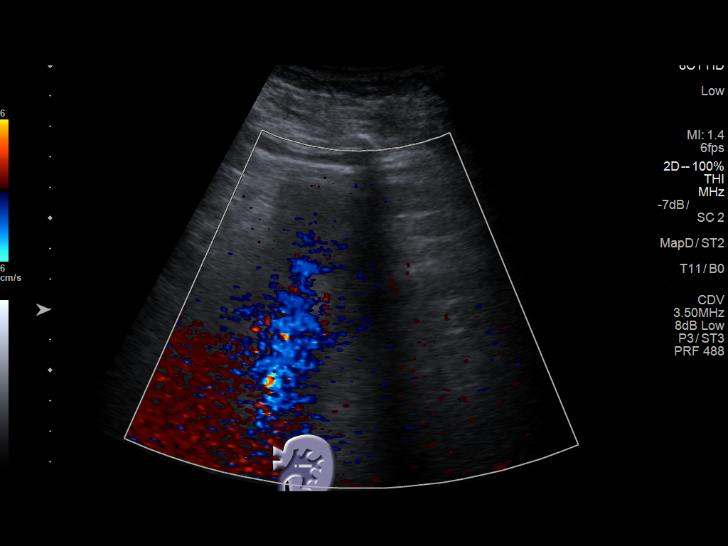
[im 23/26]
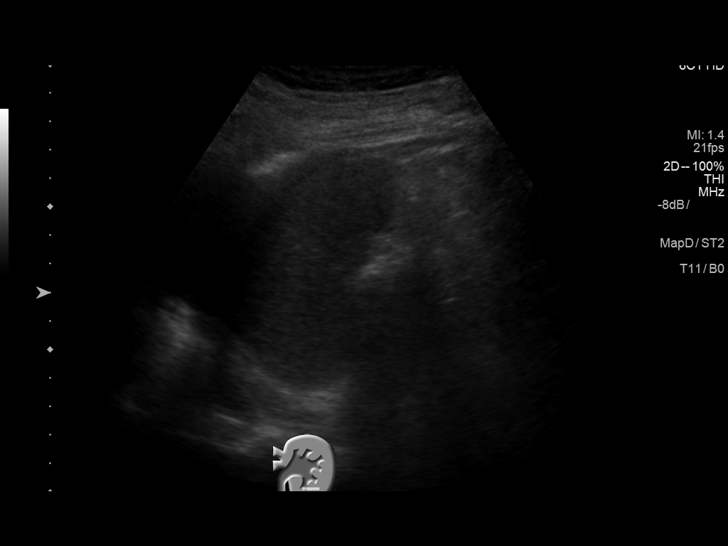
[im 26/26]
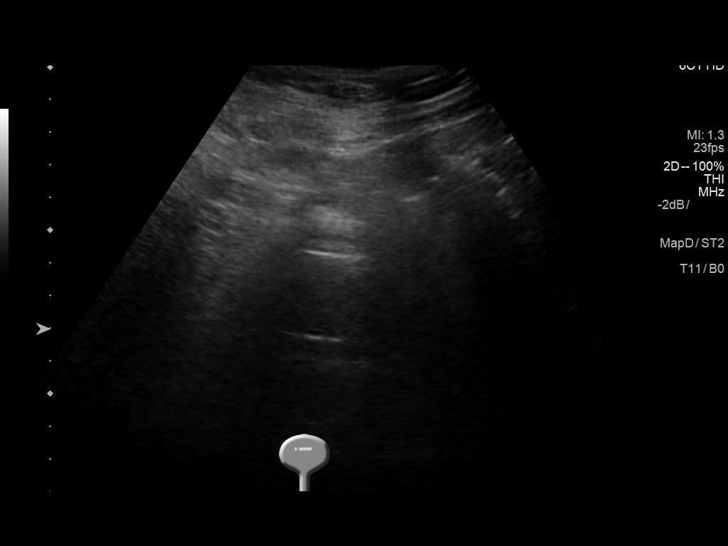

[14 of 25 positions shown; findings below may reference images not displayed]

FINDINGS: Right Kidney:

Length: 9.6 cm. Moderately increased echogenicity. No hydronephrosis
or mass.

Left Kidney:

Length: 10.3 cm. Moderately increased echogenicity. No
hydronephrosis or mass.

Bladder:

Decompressed by Foley catheter and therefore not evaluated.

Incidentally noted are bilateral pleural effusions.
IMPRESSION: 1. Medical renal disease
2. Bilateral pleural effusions

## 2019-12-24 DEATH — deceased
